# Patient Record
Sex: Male | Born: 1980 | Hispanic: Yes | Marital: Single | Smoking: Current some day smoker
Health system: Southern US, Community
[De-identification: ages and names within clinical notes are randomized; demographics above are authoritative.]

## PROBLEM LIST (undated history)

## (undated) DIAGNOSIS — Z789 Other specified health status: Secondary | ICD-10-CM

---

## 2003-08-13 HISTORY — PX: APPENDECTOMY: SHX54

## 2019-02-06 ENCOUNTER — Other Ambulatory Visit: Payer: Self-pay

## 2019-02-06 ENCOUNTER — Encounter (HOSPITAL_COMMUNITY): Payer: Self-pay | Admitting: *Deleted

## 2019-02-06 ENCOUNTER — Emergency Department (HOSPITAL_COMMUNITY): Payer: Self-pay

## 2019-02-06 ENCOUNTER — Emergency Department (HOSPITAL_COMMUNITY)
Admission: EM | Admit: 2019-02-06 | Discharge: 2019-02-06 | Disposition: A | Discharge status: Home / Self Care | Payer: Self-pay | Attending: Emergency Medicine | Admitting: Emergency Medicine

## 2019-02-06 DIAGNOSIS — Y9389 Activity, other specified: Secondary | ICD-10-CM | POA: Insufficient documentation

## 2019-02-06 DIAGNOSIS — Z23 Encounter for immunization: Secondary | ICD-10-CM | POA: Insufficient documentation

## 2019-02-06 DIAGNOSIS — Y999 Unspecified external cause status: Secondary | ICD-10-CM | POA: Insufficient documentation

## 2019-02-06 DIAGNOSIS — Y92511 Restaurant or cafe as the place of occurrence of the external cause: Secondary | ICD-10-CM | POA: Insufficient documentation

## 2019-02-06 DIAGNOSIS — S62617B Displaced fracture of proximal phalanx of left little finger, initial encounter for open fracture: Secondary | ICD-10-CM | POA: Insufficient documentation

## 2019-02-06 MED ORDER — LIDOCAINE HCL (PF) 1 % IJ SOLN
5.0000 mL | Freq: Once | INTRAMUSCULAR | Status: AC
  Administered 2019-02-06: 5 mL via INTRADERMAL
  Filled 2019-02-06: qty 5

## 2019-02-06 MED ORDER — CEPHALEXIN 500 MG PO CAPS: 500.0000 mg | ORAL_CAPSULE | Freq: Four times a day (QID) | ORAL | 0 refills | Status: DC

## 2019-02-06 MED ORDER — TETANUS-DIPHTH-ACELL PERTUSSIS 5-2.5-18.5 LF-MCG/0.5 IM SUSP
0.5000 mL | Freq: Once | INTRAMUSCULAR | Status: AC
  Administered 2019-02-06: 0.5 mL via INTRAMUSCULAR
  Filled 2019-02-06: qty 0.5

## 2019-02-06 MED ORDER — HYDROCODONE-ACETAMINOPHEN 5-325 MG PO TABS: 1.0000 | ORAL_TABLET | ORAL | 0 refills | Status: DC | PRN

## 2019-02-06 NOTE — Discharge Instructions (Signed)
Take the prescribed medication as directed.  Use caution when taking pain medication.  Make sure to keep sutures clean and dry.  They should be removed in 7-10 days. Follow-up with hand surgery-- call for appt.  Return to the ED for new or worsening symptoms.
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
## 2019-02-09 ENCOUNTER — Encounter (HOSPITAL_BASED_OUTPATIENT_CLINIC_OR_DEPARTMENT_OTHER): Payer: Self-pay | Admitting: *Deleted

## 2019-02-09 ENCOUNTER — Other Ambulatory Visit: Payer: Self-pay
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
## 2019-02-09 NOTE — Anesthesia Preprocedure Evaluation (Addendum)
Anesthesia Evaluation  Patient identified by MRN, date of birth, ID band Patient awake    Reviewed: Allergy & Precautions, NPO status , Patient's Chart, lab work & pertinent test results  Airway Mallampati: II  TM Distance: >3 FB Neck ROM: Full    Dental no notable dental hx. (+) Teeth Intact   Pulmonary neg pulmonary ROS,    Pulmonary exam normal breath sounds clear to auscultation       Cardiovascular Exercise Tolerance: Good negative cardio ROS Normal cardiovascular exam Rhythm:Regular Rate:Normal     Neuro/Psych negative neurological ROS  negative psych ROS   GI/Hepatic negative GI ROS, Neg liver ROS,   Endo/Other  negative endocrine ROS  Renal/GU negative Renal ROS     Musculoskeletal   Abdominal   Peds  Hematology negative hematology ROS (+)   Anesthesia Other Findings   Reproductive/Obstetrics                            Anesthesia Physical Anesthesia Plan  ASA: II  Anesthesia Plan: Regional   Post-op Pain Management:    Induction: Intravenous  PONV Risk Score and Plan: 2 and Treatment may vary due to age or medical condition, Ondansetron and Dexamethasone  Airway Management Planned: Nasal Cannula and Natural Airway  Additional Equipment:   Intra-op Plan:   Post-operative Plan:   Informed Consent: I have reviewed the patients History and Physical, chart, labs and discussed the procedure including the risks, benefits and alternatives for the proposed anesthesia with the patient or authorized representative who has indicated his/her understanding and acceptance.     Dental advisory given  Plan Discussed with: CRNA  Anesthesia Plan Comments: (L supraclavicular N. Block Spanish Speaking hx taken w help of translator)       Anesthesia Quick Evaluation

## 2019-02-10 ENCOUNTER — Other Ambulatory Visit (HOSPITAL_COMMUNITY)
Admission: RE | Admit: 2019-02-10 | Discharge: 2019-02-10 | Disposition: A | Discharge status: Home / Self Care | Payer: Self-pay | Source: Ambulatory Visit | Attending: Orthopedic Surgery | Admitting: Orthopedic Surgery

## 2019-02-10 ENCOUNTER — Ambulatory Visit (HOSPITAL_BASED_OUTPATIENT_CLINIC_OR_DEPARTMENT_OTHER): Payer: Self-pay | Admitting: Anesthesiology

## 2019-02-10 ENCOUNTER — Ambulatory Visit (HOSPITAL_BASED_OUTPATIENT_CLINIC_OR_DEPARTMENT_OTHER)
Admission: RE | Admit: 2019-02-10 | Discharge: 2019-02-10 | Disposition: A | Discharge status: Home / Self Care | Payer: Self-pay | Attending: Orthopedic Surgery | Admitting: Orthopedic Surgery

## 2019-02-10 ENCOUNTER — Encounter (HOSPITAL_BASED_OUTPATIENT_CLINIC_OR_DEPARTMENT_OTHER)
Admission: RE | Disposition: A | Discharge status: Home / Self Care | Payer: Self-pay | Source: Home / Self Care | Attending: Orthopedic Surgery

## 2019-02-10 ENCOUNTER — Encounter (HOSPITAL_BASED_OUTPATIENT_CLINIC_OR_DEPARTMENT_OTHER): Payer: Self-pay | Admitting: *Deleted

## 2019-02-10 DIAGNOSIS — Z1159 Encounter for screening for other viral diseases: Secondary | ICD-10-CM | POA: Insufficient documentation

## 2019-02-10 DIAGNOSIS — S62617B Displaced fracture of proximal phalanx of left little finger, initial encounter for open fracture: Secondary | ICD-10-CM | POA: Insufficient documentation

## 2019-02-10 DIAGNOSIS — S62617A Displaced fracture of proximal phalanx of left little finger, initial encounter for closed fracture: Secondary | ICD-10-CM

## 2019-02-10 HISTORY — DX: Other specified health status: Z78.9

## 2019-02-10 HISTORY — PX: OPEN REDUCTION INTERNAL FIXATION (ORIF) PROXIMAL PHALANX: SHX6235

## 2019-02-10 LAB — SARS CORONAVIRUS 2 BY RT PCR (HOSPITAL ORDER, PERFORMED IN ~~LOC~~ HOSPITAL LAB): SARS Coronavirus 2: NEGATIVE

## 2019-02-10 SURGERY — OPEN REDUCTION INTERNAL FIXATION (ORIF) PROXIMAL PHALANX
Anesthesia: Regional | Site: Hand | Laterality: Left

## 2019-02-10 MED ORDER — PROMETHAZINE HCL 25 MG/ML IJ SOLN: 6.2500 mg | INTRAMUSCULAR | Status: DC | PRN

## 2019-02-10 MED ORDER — CEFAZOLIN SODIUM-DEXTROSE 2-4 GM/100ML-% IV SOLN
2.0000 g | INTRAVENOUS | Status: AC
  Administered 2019-02-10: 2 g via INTRAVENOUS

## 2019-02-10 MED ORDER — ONDANSETRON HCL 4 MG/2ML IJ SOLN
INTRAMUSCULAR | Status: DC | PRN
  Administered 2019-02-10: 4 mg via INTRAVENOUS

## 2019-02-10 MED ORDER — FENTANYL CITRATE (PF) 100 MCG/2ML IJ SOLN
INTRAMUSCULAR | Status: AC
  Filled 2019-02-10: qty 2

## 2019-02-10 MED ORDER — CHLORHEXIDINE GLUCONATE 4 % EX LIQD: 60.0000 mL | Freq: Once | CUTANEOUS | Status: DC

## 2019-02-10 MED ORDER — HYDROMORPHONE HCL 1 MG/ML IJ SOLN: 0.2500 mg | INTRAMUSCULAR | Status: DC | PRN

## 2019-02-10 MED ORDER — MIDAZOLAM HCL 2 MG/2ML IJ SOLN
INTRAMUSCULAR | Status: AC
  Filled 2019-02-10: qty 2

## 2019-02-10 MED ORDER — LACTATED RINGERS IV SOLN: INTRAVENOUS | Status: DC

## 2019-02-10 MED ORDER — MIDAZOLAM HCL 2 MG/2ML IJ SOLN
1.0000 mg | INTRAMUSCULAR | Status: DC | PRN
  Administered 2019-02-10: 1 mg via INTRAVENOUS
  Administered 2019-02-10: 2 mg via INTRAVENOUS

## 2019-02-10 MED ORDER — ACETAMINOPHEN 10 MG/ML IV SOLN: 1000.0000 mg | Freq: Once | INTRAVENOUS | Status: DC | PRN

## 2019-02-10 MED ORDER — PROPOFOL 500 MG/50ML IV EMUL
INTRAVENOUS | Status: DC | PRN
  Administered 2019-02-10: 100 ug/kg/min via INTRAVENOUS

## 2019-02-10 MED ORDER — HYDROCODONE-ACETAMINOPHEN 7.5-325 MG PO TABS: 1.0000 | ORAL_TABLET | Freq: Once | ORAL | Status: DC | PRN

## 2019-02-10 MED ORDER — CEFAZOLIN SODIUM-DEXTROSE 2-4 GM/100ML-% IV SOLN
INTRAVENOUS | Status: AC
  Filled 2019-02-10: qty 100

## 2019-02-10 MED ORDER — FENTANYL CITRATE (PF) 100 MCG/2ML IJ SOLN
50.0000 ug | INTRAMUSCULAR | Status: DC | PRN
  Administered 2019-02-10: 100 ug via INTRAVENOUS
  Administered 2019-02-10: 25 ug via INTRAVENOUS

## 2019-02-10 MED ORDER — SCOPOLAMINE 1 MG/3DAYS TD PT72: 1.0000 | MEDICATED_PATCH | Freq: Once | TRANSDERMAL | Status: DC

## 2019-02-10 MED ORDER — LIDOCAINE HCL (CARDIAC) PF 100 MG/5ML IV SOSY
PREFILLED_SYRINGE | INTRAVENOUS | Status: DC | PRN
  Administered 2019-02-10: 30 mg via INTRAVENOUS

## 2019-02-10 MED ORDER — MEPERIDINE HCL 25 MG/ML IJ SOLN: 6.2500 mg | INTRAMUSCULAR | Status: DC | PRN

## 2019-02-10 SURGICAL SUPPLY — 71 items
BANDAGE ACE 3X5.8 VEL STRL LF (GAUZE/BANDAGES/DRESSINGS) IMPLANT
BENZOIN TINCTURE PRP APPL 2/3 (GAUZE/BANDAGES/DRESSINGS) IMPLANT
BIT DRILL 1.1 (BIT) ×1
BIT DRILL 1.5 MINI-QUICK CON (BIT) ×2 IMPLANT
BIT DRILL 60X20X1.1XQC TMX (BIT) ×1 IMPLANT
BIT DRL 60X20X1.1XQC TMX (BIT) ×1
BLADE SURG 15 STRL LF DISP TIS (BLADE) ×2 IMPLANT
BLADE SURG 15 STRL SS (BLADE) ×2
BNDG ELASTIC 2X5.8 VLCR STR LF (GAUZE/BANDAGES/DRESSINGS) IMPLANT
BNDG ESMARK 4X9 LF (GAUZE/BANDAGES/DRESSINGS) ×2 IMPLANT
CANISTER SUCT 1200ML W/VALVE (MISCELLANEOUS) IMPLANT
CORD BIPOLAR FORCEPS 12FT (ELECTRODE) ×2 IMPLANT
COVER BACK TABLE REUSABLE LG (DRAPES) ×2 IMPLANT
COVER WAND RF STERILE (DRAPES) IMPLANT
CUFF TOURN SGL QUICK 18X4 (TOURNIQUET CUFF) IMPLANT
DECANTER SPIKE VIAL GLASS SM (MISCELLANEOUS) IMPLANT
DRAPE EXTREMITY T 121X128X90 (DISPOSABLE) ×2 IMPLANT
DRAPE OEC MINIVIEW 54X84 (DRAPES) ×2 IMPLANT
DRAPE SURG 17X23 STRL (DRAPES) ×2 IMPLANT
DRSG EMULSION OIL 3X3 NADH (GAUZE/BANDAGES/DRESSINGS) ×2 IMPLANT
GAUZE 4X4 16PLY RFD (DISPOSABLE) IMPLANT
GLOVE BIO SURGEON STRL SZ 6.5 (GLOVE) ×2 IMPLANT
GLOVE BIO SURGEON STRL SZ7.5 (GLOVE) ×2 IMPLANT
GLOVE BIO SURGEON STRL SZ8 (GLOVE) ×2 IMPLANT
GLOVE BIOGEL PI IND STRL 7.0 (GLOVE) ×2 IMPLANT
GLOVE BIOGEL PI IND STRL 8 (GLOVE) ×1 IMPLANT
GLOVE BIOGEL PI IND STRL 8.5 (GLOVE) ×1 IMPLANT
GLOVE BIOGEL PI INDICATOR 7.0 (GLOVE) ×2
GLOVE BIOGEL PI INDICATOR 8 (GLOVE) ×1
GLOVE BIOGEL PI INDICATOR 8.5 (GLOVE) ×1
GOWN STRL REUS W/ TWL LRG LVL3 (GOWN DISPOSABLE) ×1 IMPLANT
GOWN STRL REUS W/TWL LRG LVL3 (GOWN DISPOSABLE) ×1
K-WIRE .045X4 (WIRE) ×2 IMPLANT
K-WIRE DBL TRONS .035X6 (WIRE) ×2
KWIRE DBL TRONS .035X6 (WIRE) ×1 IMPLANT
LOCK SCREW 1.5X9MM (Screw) ×2 IMPLANT
NEEDLE HYPO 25X1 1.5 SAFETY (NEEDLE) IMPLANT
NS IRRIG 1000ML POUR BTL (IV SOLUTION) ×2 IMPLANT
PACK BASIN DAY SURGERY FS (CUSTOM PROCEDURE TRAY) ×2 IMPLANT
PAD CAST 3X4 CTTN HI CHSV (CAST SUPPLIES) ×1 IMPLANT
PADDING CAST ABS 3INX4YD NS (CAST SUPPLIES) ×1
PADDING CAST ABS COTTON 3X4 (CAST SUPPLIES) ×1 IMPLANT
PADDING CAST COTTON 3X4 STRL (CAST SUPPLIES) ×1
PADDING UNDERCAST 2 STRL (CAST SUPPLIES)
PADDING UNDERCAST 2X4 STRL (CAST SUPPLIES) IMPLANT
PLATE BONE CONT 1.5MM LOCKING (Plate) ×2 IMPLANT
SCREW CORTICAL 1.5X9MM WRIST (Screw) ×4 IMPLANT
SCREW L 1.5X12 (Screw) ×6 IMPLANT
SCREW LOCK 1.5X9MM (Screw) ×1 IMPLANT
SCREW LOCKING 1.5X10 (Screw) ×6 IMPLANT
SCREW LOCKING 1.5X11MM (Screw) ×2 IMPLANT
SCREW NL 1.5X11 WRIST (Screw) ×4 IMPLANT
SCREW NL 1.5X12 (Screw) ×4 IMPLANT
SHEET MEDIUM DRAPE 40X70 STRL (DRAPES) ×2 IMPLANT
SLING ARM FOAM STRAP LRG (SOFTGOODS) ×2 IMPLANT
SPLINT FIBERGLASS 3X35 (CAST SUPPLIES) IMPLANT
STOCKINETTE 4X48 STRL (DRAPES) ×2 IMPLANT
STRIP CLOSURE SKIN 1/2X4 (GAUZE/BANDAGES/DRESSINGS) IMPLANT
SUCTION FRAZIER HANDLE 10FR (MISCELLANEOUS) ×1
SUCTION TUBE FRAZIER 10FR DISP (MISCELLANEOUS) ×1 IMPLANT
SUT MERSILENE 4 0 P 3 (SUTURE) ×2 IMPLANT
SUT MNCRL AB 3-0 PS2 18 (SUTURE) ×2 IMPLANT
SUT MON AB 4-0 PC3 18 (SUTURE) IMPLANT
SUT PROLENE 4 0 PS 2 18 (SUTURE) ×2 IMPLANT
SUT VIC AB 3-0 FS2 27 (SUTURE) ×2 IMPLANT
SYR BULB 3OZ (MISCELLANEOUS) ×2 IMPLANT
SYR CONTROL 10ML LL (SYRINGE) IMPLANT
TOWEL GREEN STERILE FF (TOWEL DISPOSABLE) ×4 IMPLANT
TRAY DSU PREP LF (CUSTOM PROCEDURE TRAY) ×2 IMPLANT
TUBE CONNECTING 20X1/4 (TUBING) ×2 IMPLANT
UNDERPAD 30X30 (UNDERPADS AND DIAPERS) ×2 IMPLANT

## 2019-02-10 NOTE — Anesthesia Procedure Notes (Signed)
Procedure Name: MAC Date/Time: 02/10/2019 3:38 PM Performed by: Signe Colt, CRNA Pre-anesthesia Checklist: Patient identified, Emergency Drugs available, Suction available, Patient being monitored and Timeout performed Patient Re-evaluated:Patient Re-evaluated prior to induction Oxygen Delivery Method: Simple face mask

## 2019-02-10 NOTE — Op Note (Signed)
PREOPERATIVE DIAGNOSIS: Left small finger open proximal phalanx fracture  POSTOPERATIVE DIAGNOSIS: Same  ATTENDING SURGEON: Dr. Bradly BienenstockFred Karlyn Glasco who scrubbed and present for the entire procedure  ASSISTANT SURGEON: Lambert ModySamantha Barton, PA-C was scrubbed and necessary for aid in reduction internal fixation closure and splinting in a timely fashion  ANESTHESIA: Regional with IV sedation  OPERATIVE PROCEDURE: #1: Open treatment of left small finger proximal phalanx fracture requiring internal fixation involve the articular surface of the interphalangeal joints #2: Open treatment of the small finger fracture with debridement and excision of skin subcutaneous tissue and bone #3: Radiographs 3 views left small finger  IMPLANTS: Biomet Webb plate 1.5 millimeter screws  RADIOGRAPHIC INTERPRETATION: AP lateral oblique views of the finger do show the dorsal plate fixation place with a comminuted proximal phalanx fracture with good alignment in both planes  SURGICAL INDICATIONS: Patient is a right-hand-dominant gentleman is referred from the ER who sustained the open small finger proximal phalanx fracture.  Patient was seen evaluate the office and recommended undergo the above procedure.  The risks of surgery include but not limited to bleeding infection damage nearby nerves arteries or tendons loss of motion of the wrist and digits incomplete relief of symptoms and need for further surgical intervention.  SURGICAL TECHNIQUE: Patient palpated via the preoperative holding area marked for marker made on left small finger to take the correct operative site.  Patient brought back to operating placed supine on the anesthesia table where the regional anesthetic had been administered.  Patient tolerates well.  Well-padded tourniquet placed on the left brachium stay with appropriate drape.  Left upper extremities then prepped and draped normal sterile fashion.  A timeout was called the correct site was identified  procedure then begun.  Attention then turned the small finger.  The open fracture was then opened up the wound was then extended proximally and distally.  The wound was thoroughly irrigated.  The extensor mechanism incised longitudinally expose the open fracture site.  Debridement of the skin subcutaneous tissue and bone associated with the open fracture was then carried out with small knife curettes and rongeurs.  The devitalized tissue as well as devitalized bone was then removed.  There is a highly comminuted fracture extending well into both articular surfaces both of the MP and interphalangeal joint.  The wound was thoroughly irrigated.  Following this the webb plate was then applied given the highly comminution allowing the screws to be placed in different planes.  K wire fixation was then proximally distally to hold the bone in place.  The bone was brought out to length.  The concept of internal/external fixator was then used spanning the the comminuted defect with the locking screws and nonlocking screws proximally and distally.  The wound was thoroughly irrigated.  Final radiographs were then obtained.  There showed good alignment in both planes with a dorsal plate in place.  The extensor mechanism was was then closed with Mersilene suture 4 oh.  The skin was then closed with simple 4-0 Prolene.  Adaptic dressing a sterile compressive bandage then applied.  The patient is then placed in a well-padded ulnar gutter splint taken recovery in good condition.  POSTOPERATIVE PLAN: Patient be discharged to home.  See him back in the office in approximately 8 days for wound check x-rays back in the ulnar gutter splint will keep the splint we will write [facility]based ulnar gutter splint begin some gentle active range of motion.  Radiographs at  each visit.  Sutures out at the 2-week mark.

## 2019-02-10 NOTE — Anesthesia Postprocedure Evaluation (Signed)
Anesthesia Post Note  Patient: Angel Barajas  Procedure(s) Performed: Left small finger fracture open reduction and surgical fixation, surgery as indicated (Left Hand)     Patient location during evaluation: PACU Anesthesia Type: Regional Level of consciousness: awake and alert Pain management: pain level controlled Vital Signs Assessment: post-procedure vital signs reviewed and stable Respiratory status: spontaneous breathing, nonlabored ventilation, respiratory function stable and patient connected to nasal cannula oxygen Cardiovascular status: stable and blood pressure returned to baseline Postop Assessment: no apparent nausea or vomiting Anesthetic complications: no    Last Vitals:  Vitals:   02/10/19 1746 02/10/19 1748  BP: (!) 156/105 128/89  Pulse: 69 77  Resp: 19 18  Temp:    SpO2: 99% 97%    Last Pain:  Vitals:   02/10/19 1748  TempSrc:   PainSc: 0-No pain                 Barnet Glasgow

## 2019-02-10 NOTE — Transfer of Care (Signed)
Immediate Anesthesia Transfer of Care Note  Patient: Angel Barajas  Procedure(s) Performed: Left small finger fracture open reduction and surgical fixation, surgery as indicated (Left Hand)  Patient Location: PACU  Anesthesia Type:MAC combined with regional for post-op pain  Level of Consciousness: awake, alert , oriented and patient cooperative  Airway & Oxygen Therapy: Patient Spontanous Breathing and Patient connected to nasal cannula oxygen  Post-op Assessment: Report given to RN and Post -op Vital signs reviewed and stable  Post vital signs: Reviewed and stable  Last Vitals:  Vitals Value Taken Time  BP    Temp    Pulse 73 02/10/19 1735  Resp    SpO2 99 % 02/10/19 1735  Vitals shown include unvalidated device data.  Last Pain:  Vitals:   02/10/19 1352  TempSrc: Oral  PainSc: 0-No pain         Complications: No apparent anesthesia complications

## 2019-02-10 NOTE — Anesthesia Procedure Notes (Signed)
Anesthesia Regional Block: Supraclavicular block   Pre-Anesthetic Checklist: ,, timeout performed, Correct Patient, Correct Site, Correct Laterality, Correct Procedure, Correct Position, site marked, Risks and benefits discussed,  Surgical consent,  Pre-op evaluation,  At surgeon's request and post-op pain management  Laterality: Left  Prep: chloraprep       Needles:  Injection technique: Single-shot  Needle Type: Echogenic Needle     Needle Length: 5cm  Needle Gauge: 21     Additional Needles:   Procedures:,,,, ultrasound used (permanent image in chart),,,,  Narrative:  Start time: 02/10/2019 2:15 PM End time: 02/10/2019 2:22 PM Injection made incrementally with aspirations every 5 mL.  Performed by: Personally  Anesthesiologist: Barnet Glasgow, MD

## 2019-02-10 NOTE — Progress Notes (Signed)
Assisted Dr. Houser with left, ultrasound guided, supraclavicular block. Side rails up, monitors on throughout procedure. See vital signs in flow sheet. Tolerated Procedure well. °

## 2019-02-10 NOTE — Discharge Instructions (Signed)
KEEP BANDAGE CLEAN AND DRY °CALL OFFICE FOR F/U APPT [phone] in 8 days °KEEP HAND ELEVATED ABOVE HEART °OK TO APPLY ICE TO OPERATIVE AREA °CONTACT OFFICE IF ANY WORSENING PAIN OR CONCERNS. ° ° °Post Anesthesia Home Care Instructions ° °Activity: °Get plenty of rest for the remainder of the day. A responsible individual must stay with you for 24 hours following the procedure.  °For the next 24 hours, DO NOT: °-Drive a car °-Operate machinery °-Drink alcoholic beverages °-Take any medication unless instructed by your physician °-Make any legal decisions or sign important papers. ° °Meals: °Start with liquid foods such as gelatin or soup. Progress to regular foods as tolerated. Avoid greasy, spicy, heavy foods. If nausea and/or vomiting occur, drink only clear liquids until the nausea and/or vomiting subsides. Call your physician if vomiting continues. ° °Special Instructions/Symptoms: °Your throat may feel dry or sore from the anesthesia or the breathing tube placed in your throat during surgery. If this causes discomfort, gargle with warm salt water. The discomfort should disappear within 24 hours. ° °If you had a scopolamine patch placed behind your ear for the management of post- operative nausea and/or vomiting: ° °1. The medication in the patch is effective for 72 hours, after which it should be removed.  Wrap patch in a tissue and discard in the trash. Wash hands thoroughly with soap and water. °2. You may remove the patch earlier than 72 hours if you experience unpleasant side effects which may include dry mouth, dizziness or visual disturbances. °3. Avoid touching the patch. Wash your hands with soap and water after contact with the patch. °   ° °

## 2019-02-15 ENCOUNTER — Encounter (HOSPITAL_BASED_OUTPATIENT_CLINIC_OR_DEPARTMENT_OTHER): Payer: Self-pay | Admitting: Orthopedic Surgery

## 2019-03-05 ENCOUNTER — Other Ambulatory Visit: Payer: Self-pay

## 2019-03-05 ENCOUNTER — Ambulatory Visit: Payer: Self-pay | Attending: Orthopedic Surgery | Admitting: Occupational Therapy

## 2019-03-05 DIAGNOSIS — M6281 Muscle weakness (generalized): Secondary | ICD-10-CM

## 2019-03-05 DIAGNOSIS — M25642 Stiffness of left hand, not elsewhere classified: Secondary | ICD-10-CM

## 2019-03-05 DIAGNOSIS — R278 Other lack of coordination: Secondary | ICD-10-CM

## 2019-03-05 DIAGNOSIS — M79642 Pain in left hand: Secondary | ICD-10-CM
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
## 2019-03-05 NOTE — Patient Instructions (Signed)
Your Splint This splint should initially be fitted by a healthcare practitioner.  The healthcare practitioner is responsible for providing wearing instructions and precautions to the patient, other healthcare practitioners and care provider involved in the patient's care.  This splint was custom made for you. Please read the following instructions to learn about wearing and caring for your splint.  Precautions Should your splint cause any of the following problems, remove the splint immediately and contact your therapist/physician.  Swelling  Severe Pain  Pressure Areas or redness  Stiffness  Numbness  Do not wear your splint while operating machinery unless it has been fabricated for that purpose.  When To Wear Your Splint Where your splint according to your therapist/physician instructions. All the time except for bathing and exercising, do not submerge your hand, just bathe in the shower. Remove your splint to exercise every 2 hours, keep your hand clean and dry No heavy lifting  Care and Cleaning of Your Splint 1. Keep your splint away from open flames. 2. Your splint will lose its shape in temperatures over 135 degrees Farenheit, ( in car windows, near radiators, ovens or in hot water).  Never make any adjustments to your splint, if the splint needs adjusting remove it and make an appointment to see your therapist. 3. Your splint, including the cushion liner may be cleaned with soap and lukewarm water.  Do not immerse in hot water over 135 degrees Farenheit. 4. Straps may be washed with soap and water, but do not moisten the self-adhesive portion.       MP Flexion (Active Isolated)   Bend ___small___ finger at large knuckle, keeping other fingers straight. Do not bend tips. Repeat _10-15___ times. Do __4-6__ sessions per day.  AROM: PIP Flexion / Extension   Pinch bottom knuckle of __small______ finger of hand to prevent bending. Actively bend middle knuckle until  stretch is felt. Hold __5__ seconds. Relax. Straighten finger as far as possible. Repeat __10-15__ times per set. Do _4-6___ sessions per day.   AROM: DIP Flexion / Extension   Pinch middle knuckle of ___small _____ finger of  hand to prevent bending. Bend end knuckle until stretch is felt. Hold _5___ seconds. Relax. Straighten finger as far as possible. Repeat _10-15___ times per set.  Do _4-6___ sessions per day.  AROM: Finger Flexion / Extension   Actively bend fingers of  hand. Start with knuckles furthest from palm, and slowly make a fist. Hold __5__ seconds. Relax. Then straighten fingers as far as possible. Repeat _10-15___ times per set.  Do _4-6___ sessions per day.  Copyright  VHI. All rights reserved.

## 2019-03-10 ENCOUNTER — Other Ambulatory Visit: Payer: Self-pay

## 2019-03-10 ENCOUNTER — Ambulatory Visit: Payer: Self-pay | Admitting: Occupational Therapy

## 2019-03-10 DIAGNOSIS — M25642 Stiffness of left hand, not elsewhere classified: Secondary | ICD-10-CM

## 2019-03-10 DIAGNOSIS — M79642 Pain in left hand: Secondary | ICD-10-CM

## 2019-03-10 NOTE — Patient Instructions (Signed)
PROM: Finger MP Joints   Passively bend ___small_____ finger of hand at big knuckle until stretch is felt. Hold _10___ seconds. Relax. Straighten finger as far as possible. Repeat __5__ times per set.  Do __6__ sessions per day.   PIP Flexion (Passive)   Use other hand to bend the middle joint of ___small___ finger down as far as possible, while keeping big knuckle straight. Hold _10___ seconds. Repeat __5__ times. Do _6___ sessions per day.    PROM: Finger DIP Joints   Passively bend ___small_____ finger(s) of  hand at tip joint until stretch is felt, keeping middle joint straight. Hold __10__ seconds. Relax. Straighten finger as far as possible. Repeat _5___ times per set.  Do __6__ sessions per day.  MP / PIP / DIP Composite Flexion (Passive Stretch)    Use su otra mano para doblar las tres articulaciones del dedo _pinky_____. Mantenga _10___ segundos. Repita __5__ veces. Haga __6__ sesiones por da.  PIP Extension (Passive    Coloque el pulgar de su otra mano por encima de la articulacin y dos dedos por debajo a cada lado para enderezar la articulacin media del dedo _pinky_____. Mantenga _10___ segundos. Repita __5__ veces. Haga _6___ sesiones por da.  Copyright  VHI. All rights reserved.

## 2019-03-10 NOTE — Therapy (Signed)
[facility] [address] [place], [place], [address] Phone: [phone]   Fax:  [phone]  Occupational Therapy Treatment  Patient Details  Name: Angel BrownsMario Barajas MRN: [id number] Date of Birth: Aug 05, 1981 Referring Provider (OT): Dr. Melvyn Novasrtmann   Encounter Date: 03/10/2019  OT End of Session - 03/10/19 0851    Visit Number  2    Number of Visits  12    Date for OT Re-Evaluation  06/03/19    Authorization Type  self pay    OT Start Time  0800    OT Stop Time  0850    OT Time Calculation (min)  50 min    Activity Tolerance  Patient tolerated treatment well    Behavior During Therapy  [facility] for tasks assessed/performed       Past Medical History:  Diagnosis Date  . Medical history non-contributory     Past Surgical History:  Procedure Laterality Date  . APPENDECTOMY  2005  . OPEN REDUCTION INTERNAL FIXATION (ORIF) PROXIMAL PHALANX Left 02/10/2019   Procedure: Left small finger fracture open reduction and surgical fixation, surgery as indicated;  Surgeon: Bradly Bienenstockrtmann, Fred, MD;  Location: [facility];  Service: Orthopedics;  Laterality: Left;  45min    There were no vitals filed for this visit.  Subjective Assessment - 03/10/19 0806    Subjective   I fogot my splint (per girlfriend)    Patient is accompanied by:  Family member   girlfriend   Currently in Pain?  Yes    Pain Score  3    UP TO 10/10 w/ passive PIP flexion   Pain Location  Finger (Comment which one)   small finger   Pain Orientation  Left    Pain Descriptors / Indicators  Aching    Pain Type  Acute pain    Pain Onset  1 to 4 weeks ago    Pain Frequency  Constant    Aggravating Factors   exercise, P/ROM    Pain Relieving Factors  rest, medicine       CLINIC OPERATION CHANGES: Outpatient Neuro Rehab is open at lower capacity following universal masking, social distancing, and patient screening.  The patient's COVID risk of complications  score is 2.   Pt forgot to don splint after showering this morning, and therefore arrived w/o splint. Emphasized that he still needs to wear between ex's and at night and to only remove for ex's and showering.   Reviewed A/ROM HEP. Added P/ROM HEP for isolated MP, PIP, and DIP flexion, and composite passive flexion and extension. Also instructed in place and hold ex's for PIP and DIP flexion as able. Pt unable to maintain flexion in either joint after placement.   Pt/girlfriend instructed in scar massage to perform dorsally along incision as well as volarly (suspect scar tissue adhering to tendons)  Girlfriend present and interpreted all instruction                  OT Education - 03/10/19 0831    Education Details  P/ROM HEP    Person(s) Educated  Patient   GIRLFRIEND   Methods  Explanation;Demonstration;Handout    Comprehension  Verbalized understanding;Returned demonstration       OT Short Term Goals - 03/10/19 0851      OT SHORT TERM GOAL #1   Title  I wiuth initial HEP.    Time  6    Period  Weeks    Status  On-going  Target Date  04/19/19      OT SHORT TERM GOAL #2   Title  I with splint wear care and precautions.    Time  6    Period  Weeks    Status  On-going      OT SHORT TERM GOAL #3   Title  Pt will increase A/ROM MP flexion to 75* for increased LUE functional use.    Time  6    Period  Weeks    Status  On-going      OT SHORT TERM GOAL #4   Title  Pt will demonstrate at least 60* PIP flexion in prep for increased functional use.    Time  6    Period  Weeks    Status  On-going        OT Long Term Goals - 03/05/19 1343      OT LONG TERM GOAL #1   Title  I with updated HEP.    Time  12    Period  Weeks    Status  New    Target Date  06/03/19      OT LONG TERM GOAL #2   Title  Pt will demonstrate grip strength of at least 30 lbs for increased LUE functional use.    Time  12    Period  Weeks    Status  New      OT LONG TERM GOAL  #3   Title  Pt will demonstrate at least 90% composite flexion for LUE including 5th digit.    Baseline  inially only grossly 50% composite finger flexion,    Time  12    Period  Weeks    Status  New      OT LONG TERM GOAL #4   Title  Pt will resume use of LUE as a non dominant assist 90% of the time for ADLs/ IADLs with pain less than or equal to 3/10    Time  12    Period  Weeks    Status  New            Plan - 03/10/19 [id number]    Clinical Impression Statement  Pt returns today after initial eval and splinting to progress HEP to include P/ROM. Pt is now 4 weeks post-op and very stiff w/ very limited A/ROM and P/ROM at PIP joint and A/ROM at DIP joint    Occupational performance deficits (Please refer to evaluation for details):  ADL's;IADL's;Work;Leisure;Social Participation    Body Structure / Function / Physical Skills  ADL;Edema;Balance;Endurance;Mobility;Strength;Flexibility;UE functional use;FMC;Pain;Coordination;Decreased knowledge of precautions;GMC;ROM;Decreased knowledge of use of DME;Dexterity;IADL    Rehab Potential  Good    OT Frequency  1x / week    OT Duration  12 weeks    OT Treatment/Interventions  Self-care/ADL training;Therapeutic exercise;Ultrasound;Neuromuscular education;Manual Therapy;Splinting;Therapeutic activities;Cryotherapy;Paraffin;DME and/or AE instruction;Fluidtherapy;Electrical Stimulation;Moist Heat;Contrast Bath;Passive range of motion;Patient/family education    Plan  try ultrasound both dorsally and volarly at small finger proximal phalanx, splint check/adjustment prn as pt did not bring today, continue A/ROM, P/ROM, and place and hold as able    Consulted and Agree with Plan of Care  Patient;Family member/caregiver    Family Member Consulted  girlfriend       Patient will benefit from skilled therapeutic intervention in order to improve the following deficits and impairments:   Body Structure / Function / Physical Skills: ADL, Edema, Balance,  Endurance, Mobility, Strength, Flexibility, UE functional use, FMC, Pain, Coordination, Decreased knowledge of precautions,  GMC, ROM, Decreased knowledge of use of DME, Dexterity, IADL       Visit Diagnosis: 1. Stiffness of left hand, not elsewhere classified   2. Pain in left hand       Problem List There are no active problems to display for this patient.   Carey Bullocks, OTR/L 03/10/2019, 8:55 AM  [facility] [address] [place], [place], [address] Phone: [phone]   Fax:  [phone]  Name: Ra Pfiester MRN: [id number] Date of Birth: Jun 26, 1981

## 2019-03-26 ENCOUNTER — Ambulatory Visit: Payer: Self-pay | Admitting: Occupational Therapy

## 2019-04-02 ENCOUNTER — Ambulatory Visit: Payer: Self-pay | Attending: Orthopedic Surgery | Admitting: Occupational Therapy

## 2019-04-02 ENCOUNTER — Other Ambulatory Visit: Payer: Self-pay

## 2019-04-02 DIAGNOSIS — R278 Other lack of coordination: Secondary | ICD-10-CM | POA: Insufficient documentation

## 2019-04-02 DIAGNOSIS — M6281 Muscle weakness (generalized): Secondary | ICD-10-CM | POA: Insufficient documentation

## 2019-04-02 DIAGNOSIS — M79642 Pain in left hand: Secondary | ICD-10-CM | POA: Insufficient documentation

## 2019-04-02 DIAGNOSIS — M25642 Stiffness of left hand, not elsewhere classified: Secondary | ICD-10-CM | POA: Insufficient documentation

## 2019-04-02 NOTE — Therapy (Signed)
[facility] [address] [place], [place], [address] Phone: [phone]   Fax:  [phone]  Occupational Therapy Treatment  Patient Details  Name: Angel Barajas MRN: [id number] Date of Birth: Feb 22, 1981 Referring Provider (OT): Dr. Caralyn Guile   Encounter Date: 04/02/2019  OT End of Session - 04/02/19 1646    Visit Number  3    Number of Visits  12    Date for OT Re-Evaluation  06/03/19    Authorization Type  self pay    OT Start Time  0810    OT Stop Time  0845    OT Time Calculation (min)  35 min    Activity Tolerance  Patient tolerated treatment well    Behavior During Therapy  [facility] for tasks assessed/performed       Past Medical History:  Diagnosis Date  . Medical history non-contributory     Past Surgical History:  Procedure Laterality Date  . APPENDECTOMY  2005  . OPEN REDUCTION INTERNAL FIXATION (ORIF) PROXIMAL PHALANX Left 02/10/2019   Procedure: Left small finger fracture open reduction and surgical fixation, surgery as indicated;  Surgeon: Iran Planas, MD;  Location: [facility];  Service: Orthopedics;  Laterality: Left;  85min    There were no vitals filed for this visit.           Treatment: Fluidotherapy x 10 mins to RUE for stiffness, no adverse reactions. Splint modifications for improved fit ar webspace and thumb. Pt reports increased comfort following modifications. Reveiwed composite A/ROM, PROM exercises as well as blocking PIP and DIP joints followed by  place and holds for composite flexion , pt demonstrates significantly impaired ROM.               OT Short Term Goals - 03/10/19 1025      OT SHORT TERM GOAL #1   Title  I wiuth initial HEP.    Time  6    Period  Weeks    Status  On-going    Target Date  04/19/19      OT SHORT TERM GOAL #2   Title  I with splint wear care and precautions.    Time  6    Period  Weeks    Status  On-going      OT  SHORT TERM GOAL #3   Title  Pt will increase A/ROM MP flexion to 75* for increased LUE functional use.    Time  6    Period  Weeks    Status  On-going      OT SHORT TERM GOAL #4   Title  Pt will demonstrate at least 60* PIP flexion in prep for increased functional use.    Time  6    Period  Weeks    Status  On-going        OT Long Term Goals - 03/05/19 1343      OT LONG TERM GOAL #1   Title  I with updated HEP.    Time  12    Period  Weeks    Status  New    Target Date  06/03/19      OT LONG TERM GOAL #2   Title  Pt will demonstrate grip strength of at least 30 lbs for increased LUE functional use.    Time  12    Period  Weeks    Status  New      OT LONG TERM GOAL #3  Title  Pt will demonstrate at least 90% composite flexion for LUE including 5th digit.    Baseline  inially only grossly 50% composite finger flexion,    Time  12    Period  Weeks    Status  New      OT LONG TERM GOAL #4   Title  Pt will resume use of LUE as a non dominant assist 90% of the time for ADLs/ IADLs with pain less than or equal to 3/10    Time  12    Period  Weeks    Status  New            Plan - 04/02/19 1647    Clinical Impression Statement  Pt is now 7 weeks post-op and very stiff w/ very limited A/ROM and P/ROM at PIP joint and A/ROM at DIP joint. Pt was encouraged to exercise more frequently.    Occupational performance deficits (Please refer to evaluation for details):  ADL's;IADL's;Work;Leisure;Social Participation    Body Structure / Function / Physical Skills  ADL;Edema;Balance;Endurance;Mobility;Strength;Flexibility;UE functional use;FMC;Pain;Coordination;Decreased knowledge of precautions;GMC;ROM;Decreased knowledge of use of DME;Dexterity;IADL    Rehab Potential  Good    OT Frequency  1x / week    OT Duration  12 weeks    OT Treatment/Interventions  Self-care/ADL training;Therapeutic exercise;Ultrasound;Neuromuscular education;Manual Therapy;Splinting;Therapeutic  activities;Cryotherapy;Paraffin;DME and/or AE instruction;Fluidtherapy;Electrical Stimulation;Moist Heat;Contrast Bath;Passive range of motion;Patient/family education    Plan  try ultrasound both dorsally and volarly at small finger proximal phalanx,  continue A/ROM, P/ROM, and place and hold as able    Consulted and Agree with Plan of Care  Patient;Family member/caregiver    Family Member Consulted  girlfriend       Patient will benefit from skilled therapeutic intervention in order to improve the following deficits and impairments:   Body Structure / Function / Physical Skills: ADL, Edema, Balance, Endurance, Mobility, Strength, Flexibility, UE functional use, FMC, Pain, Coordination, Decreased knowledge of precautions, GMC, ROM, Decreased knowledge of use of DME, Dexterity, IADL       Visit Diagnosis: No diagnosis found.    Problem List There are no active problems to display for this patient.   Angel Barajas 04/02/2019, 4:49 PM   [facility] [address] [place], [place], [address] Phone: [phone]   Fax:  [phone]  Name: Angel Barajas MRN: [id number] Date of Birth: 1980/11/01

## 2019-04-07 ENCOUNTER — Other Ambulatory Visit: Payer: Self-pay

## 2019-04-07 ENCOUNTER — Ambulatory Visit: Payer: Self-pay | Admitting: Occupational Therapy

## 2019-04-07 DIAGNOSIS — M79642 Pain in left hand: Secondary | ICD-10-CM

## 2019-04-07 DIAGNOSIS — M6281 Muscle weakness (generalized): Secondary | ICD-10-CM

## 2019-04-07 DIAGNOSIS — M25642 Stiffness of left hand, not elsewhere classified: Secondary | ICD-10-CM

## 2019-04-07 DIAGNOSIS — R278 Other lack of coordination: Secondary | ICD-10-CM

## 2019-04-07 NOTE — Therapy (Signed)
[facility] [address] [place], [place], [address] Phone: [phone]   Fax:  [phone]  Occupational Therapy Treatment  Patient Details  Name: Angel BrownsMario Barajas MRN: [id number] Date of Birth: [birth year] Referring Provider (OT): Dr. Melvyn Novasrtmann   Encounter Date: 04/07/2019  OT End of Session - 04/07/19 1050    Visit Number  4    Number of Visits  12    Date for OT Re-Evaluation  06/03/19    Authorization Type  self pay    OT Start Time  1021    OT Stop Time  1058    OT Time Calculation (min)  37 min    Activity Tolerance  Patient tolerated treatment well    Behavior During Therapy  [facility] for tasks assessed/performed       Past Medical History:  Diagnosis Date  . Medical history non-contributory     Past Surgical History:  Procedure Laterality Date  . APPENDECTOMY  2005  . OPEN REDUCTION INTERNAL FIXATION (ORIF) PROXIMAL PHALANX Left 02/10/2019   Procedure: Left small finger fracture open reduction and surgical fixation, surgery as indicated;  Surgeon: Bradly Bienenstockrtmann, Fred, MD;  Location: [facility];  Service: Orthopedics;  Laterality: Left;  45min    There were no vitals filed for this visit.  Subjective Assessment - 04/07/19 1441    Patient is accompanied by:  Family member   girlfriend   Currently in Pain?  Yes    Pain Score  3     Pain Location  Finger (Comment which one)    Pain Orientation  Left    Pain Descriptors / Indicators  Aching    Pain Type  Acute pain    Pain Onset  More than a month ago    Pain Frequency  Intermittent    Aggravating Factors   exercise    Pain Relieving Factors  rest              Treatment: Ultrasound 3mhz, 0.8 w/cm 2, 20%x8 mins  to volar left 5th digit, no adverse reactions. PROM composite finger flexion while estim applied to finger flexors 50 pps, 250 pw, 10 secs cycle intenity 22, x 8  mins no adverse reactions. P/RPM composite finger flexion, place and  holds, PIP and DIP flexion, attempted A/ROM DIP and PIP flexion however pt demonstrates minimal movement. Pt arrived wearing splint today. He reports that if feels better following adjustments. Pt was instructed to continue wearing splint at work however while he is at home he should remove splint and exercise his hand more often.               OT Short Term Goals - 03/10/19 [id number]      OT SHORT TERM GOAL #1   Title  I wiuth initial HEP.    Time  6    Period  Weeks    Status  On-going    Target Date  04/19/19      OT SHORT TERM GOAL #2   Title  I with splint wear care and precautions.    Time  6    Period  Weeks    Status  On-going      OT SHORT TERM GOAL #3   Title  Pt will increase A/ROM MP flexion to 75* for increased LUE functional use.    Time  6    Period  Weeks    Status  On-going      OT SHORT TERM GOAL #  4   Title  Pt will demonstrate at least 60* PIP flexion in prep for increased functional use.    Time  6    Period  Weeks    Status  On-going        OT Long Term Goals - 03/05/19 1343      OT LONG TERM GOAL #1   Title  I with updated HEP.    Time  12    Period  Weeks    Status  New    Target Date  06/03/19      OT LONG TERM GOAL #2   Title  Pt will demonstrate grip strength of at least 30 lbs for increased LUE functional use.    Time  12    Period  Weeks    Status  New      OT LONG TERM GOAL #3   Title  Pt will demonstrate at least 90% composite flexion for LUE including 5th digit.    Baseline  inially only grossly 50% composite finger flexion,    Time  12    Period  Weeks    Status  New      OT LONG TERM GOAL #4   Title  Pt will resume use of LUE as a non dominant assist 90% of the time for ADLs/ IADLs with pain less than or equal to 3/10    Time  12    Period  Weeks    Status  New            Plan - 04/07/19 1223    Clinical Impression Statement  Pt demonstrates limited progress towards goals. Pt has significant stiffness in 5th  digit and minimal active movement at DIP, PIP.    Occupational performance deficits (Please refer to evaluation for details):  ADL's;IADL's;Work;Leisure;Social Participation    Body Structure / Function / Physical Skills  ADL;Edema;Balance;Endurance;Mobility;Strength;Flexibility;UE functional use;FMC;Pain;Coordination;Decreased knowledge of precautions;GMC;ROM;Decreased knowledge of use of DME;Dexterity;IADL    Rehab Potential  Good    OT Frequency  1x / week    OT Duration  12 weeks    OT Treatment/Interventions  Self-care/ADL training;Therapeutic exercise;Ultrasound;Neuromuscular education;Manual Therapy;Splinting;Therapeutic activities;Cryotherapy;Paraffin;DME and/or AE instruction;Fluidtherapy;Electrical Stimulation;Moist Heat;Contrast Bath;Passive range of motion;Patient/family education    Plan  paraffin with hand wrapped in flexion ultrasound  at small finger proximal phalanx,  continue A/ROM, P/ROM, and place and hold as able    Consulted and Agree with Plan of Care  Patient;Family member/caregiver    Family Member Consulted  girlfriend       Patient will benefit from skilled therapeutic intervention in order to improve the following deficits and impairments:   Body Structure / Function / Physical Skills: ADL, Edema, Balance, Endurance, Mobility, Strength, Flexibility, UE functional use, FMC, Pain, Coordination, Decreased knowledge of precautions, GMC, ROM, Decreased knowledge of use of DME, Dexterity, IADL       Visit Diagnosis: Stiffness of left hand, not elsewhere classified  Pain in left hand  Muscle weakness (generalized)  Other lack of coordination    Problem List There are no active problems to display for this patient.   Mehar Sagen 04/07/2019, 2:42 PM  [facility] [address] [place], [place], [address] Phone: [phone]   Fax:  [phone]  Name: Angel Barajas MRN: [id number] Date of Birth:  Dec 27, 1980

## 2019-04-12 ENCOUNTER — Ambulatory Visit: Payer: Self-pay | Admitting: Occupational Therapy

## 2019-04-21 ENCOUNTER — Ambulatory Visit: Payer: Self-pay | Attending: Orthopedic Surgery | Admitting: Occupational Therapy

## 2019-04-21 ENCOUNTER — Encounter: Payer: Self-pay | Admitting: Occupational Therapy

## 2019-04-21 ENCOUNTER — Other Ambulatory Visit: Payer: Self-pay

## 2019-04-21 DIAGNOSIS — M79642 Pain in left hand: Secondary | ICD-10-CM | POA: Insufficient documentation

## 2019-04-21 DIAGNOSIS — M6281 Muscle weakness (generalized): Secondary | ICD-10-CM | POA: Insufficient documentation

## 2019-04-21 DIAGNOSIS — R278 Other lack of coordination: Secondary | ICD-10-CM | POA: Insufficient documentation

## 2019-04-21 DIAGNOSIS — M25642 Stiffness of left hand, not elsewhere classified: Secondary | ICD-10-CM | POA: Insufficient documentation

## 2019-04-21 NOTE — Patient Instructions (Addendum)
   1. Grip Strengthening (Resistive Putty)   Squeeze putty using thumb and all fingers. Repeat _20___ times. Do __2__ sessions per day.      Copyright  VHI. All rights reserved.            Dr. Caralyn Guile, Angel Barajas has been receiving occupational therapy. Angel Barajas progress has been limited. He is unable to actively flex at PIP and DIP joints of 5th digit left hand. He demonstrates 90 MP flexion at 5th digit. We have used fluidotherapy, massage, ultrasound, estim and exercise to attempt to increase Angel Barajas movement. Please advise as to whether you think continued therapy will be benefical or if he has reached maximum benefit.  Thanks,  [employer], OTR/L

## 2019-04-21 NOTE — Therapy (Signed)
[facility] [address] [place], [place], [address] Phone: [phone]   Fax:  [phone]  Occupational Therapy Treatment  Patient Details  Name: Angel Barajas MRN: [id number] Date of Birth: 1981/04/24 Referring Provider (OT): Dr. Caralyn Guile   Encounter Date: 04/21/2019  OT End of Session - 04/21/19 1031    Visit Number  5    Number of Visits  12    Date for OT Re-Evaluation  06/03/19    Authorization Type  self pay    OT Start Time  1018    OT Stop Time  1100    OT Time Calculation (min)  42 min    Activity Tolerance  Patient tolerated treatment well    Behavior During Therapy  [facility] for tasks assessed/performed       Past Medical History:  Diagnosis Date  . Medical history non-contributory     Past Surgical History:  Procedure Laterality Date  . APPENDECTOMY  2005  . OPEN REDUCTION INTERNAL FIXATION (ORIF) PROXIMAL PHALANX Left 02/10/2019   Procedure: Left small finger fracture open reduction and surgical fixation, surgery as indicated;  Surgeon: Iran Planas, MD;  Location: [facility];  Service: Orthopedics;  Laterality: Left;  84mn    There were no vitals filed for this visit.  Subjective Assessment - 04/21/19 1031    Patient is accompanied by:  --   girlfriend   Currently in Pain?  Yes    Pain Score  3     Pain Location  Finger (Comment which one)    Pain Orientation  Left    Pain Descriptors / Indicators  Aching    Pain Onset  More than a month ago    Pain Frequency  Intermittent    Aggravating Factors   exercise    Pain Relieving Factors  heat              Treatment: Fluidotherapy to left hand x 10 mins with fingers wrapped in coban in flexion. No adverse reactions. Retrograde massage to 5th digit followed by passive composite finger flexion and  place and holds, A/ROM composite flexion.  Attempted A/ROM PIP, and DIP flexion however pt is unable to perform. Pt was issued  yellow theraputty an instructed in use, he returned demonstration. Buddy strap applied to ring and small finger for AA/ROM composite finger flexion. Gripper set a level 3 to pick up 1 inch blocks while wearing buddy strap for sustained grip. Note sent with pt. To MD.             OT Education - 04/21/19 1601    Education Details  yellow putty exercises, use of buddy strap.    Person(s) Educated  Patient   GIRLFRIEND   Methods  Explanation;Demonstration    Comprehension  Verbalized understanding;Returned demonstration       OT Short Term Goals - 04/21/19 1054      OT SHORT TERM GOAL #1   Title  I wiuth initial HEP.    Time  6    Period  Weeks    Status  Achieved    Target Date  04/19/19      OT SHORT TERM GOAL #2   Title  I with splint wear care and precautions.    Time  6    Period  Weeks    Status  Achieved      OT SHORT TERM GOAL #3   Title  Pt will increase A/ROM MP flexion to 75*  for increased LUE functional use.    Time  6    Period  Weeks    Status  Achieved   90     OT SHORT TERM GOAL #4   Title  Pt will demonstrate at least 60* PIP flexion in prep for increased functional use.    Time  6    Period  Weeks    Status  Not Met        OT Long Term Goals - 04/21/19 1054      OT LONG TERM GOAL #1   Title  I with updated HEP.    Time  12    Period  Weeks    Status  New      OT LONG TERM GOAL #2   Title  Pt will demonstrate grip strength of at least 30 lbs for increased LUE functional use.    Time  12    Period  Weeks    Status  New   25 lbs without buddy strap, 40 lbs with buddy strap     OT LONG TERM GOAL #3   Title  Pt will demonstrate at least 90% composite flexion for LUE including 5th digit.    Baseline  inially only grossly 50% composite finger flexion,    Time  12    Period  Weeks    Status  On-going      OT LONG TERM GOAL #4   Title  Pt will resume use of LUE as a non dominant assist 90% of the time for ADLs/ IADLs with pain less  than or equal to 3/10    Time  12    Period  Weeks    Status  New            Plan - 04/21/19 1602    Clinical Impression Statement  Pt demonstrates limited progress towards goals. Pt has significant stiffness in 5th digit and minimal active movement at DIP, PIP. Note was sent with pt. to take to MD    Occupational performance deficits (Please refer to evaluation for details):  ADL's;IADL's;Work;Leisure;Social Participation    Body Structure / Function / Physical Skills  ADL;Edema;Balance;Endurance;Mobility;Strength;Flexibility;UE functional use;FMC;Pain;Coordination;Decreased knowledge of precautions;GMC;ROM;Decreased knowledge of use of DME;Dexterity;IADL    Rehab Potential  Good    OT Frequency  1x / week    OT Duration  12 weeks    OT Treatment/Interventions  Self-care/ADL training;Therapeutic exercise;Ultrasound;Neuromuscular education;Manual Therapy;Splinting;Therapeutic activities;Cryotherapy;Paraffin;DME and/or AE instruction;Fluidtherapy;Electrical Stimulation;Moist Heat;Contrast Bath;Passive range of motion;Patient/family education    Plan  continue A/ROM, P/ROM, and place and hold as able, strenghtening    Consulted and Agree with Plan of Care  Patient;Other (Comment)    Family Member Consulted  video interpreter       Patient will benefit from skilled therapeutic intervention in order to improve the following deficits and impairments:   Body Structure / Function / Physical Skills: ADL, Edema, Balance, Endurance, Mobility, Strength, Flexibility, UE functional use, FMC, Pain, Coordination, Decreased knowledge of precautions, GMC, ROM, Decreased knowledge of use of DME, Dexterity, IADL       Visit Diagnosis: Stiffness of left hand, not elsewhere classified  Pain in left hand  Muscle weakness (generalized)  Other lack of coordination    Problem List There are no active problems to display for this patient.   Angel Barajas 04/21/2019, 4:07 PM  [facility] [address] [place], [place], [address] Phone: [phone]   Fax:  [phone]  Name:  Angel Barajas MRN: [id number] Date of Birth: 08-17-1980

## 2019-04-27 ENCOUNTER — Ambulatory Visit: Payer: Self-pay | Admitting: Occupational Therapy

## 2019-07-19 ENCOUNTER — Encounter (HOSPITAL_BASED_OUTPATIENT_CLINIC_OR_DEPARTMENT_OTHER): Payer: Self-pay | Admitting: *Deleted

## 2019-07-19 ENCOUNTER — Other Ambulatory Visit: Payer: Self-pay

## 2019-07-22 ENCOUNTER — Other Ambulatory Visit (HOSPITAL_COMMUNITY)
Admission: RE | Admit: 2019-07-22 | Discharge: 2019-07-22 | Disposition: A | Discharge status: Home / Self Care | Payer: Self-pay | Source: Ambulatory Visit | Attending: Orthopedic Surgery | Admitting: Orthopedic Surgery

## 2019-07-22 DIAGNOSIS — Z20828 Contact with and (suspected) exposure to other viral communicable diseases: Secondary | ICD-10-CM | POA: Insufficient documentation

## 2019-07-22 DIAGNOSIS — Z01812 Encounter for preprocedural laboratory examination: Secondary | ICD-10-CM | POA: Insufficient documentation

## 2019-07-24 LAB — NOVEL CORONAVIRUS, NAA (HOSP ORDER, SEND-OUT TO REF LAB; TAT 18-24 HRS): SARS-CoV-2, NAA: NOT DETECTED

## 2019-07-26 ENCOUNTER — Ambulatory Visit (HOSPITAL_BASED_OUTPATIENT_CLINIC_OR_DEPARTMENT_OTHER): Admission: RE | Admit: 2019-07-26 | Payer: Self-pay | Source: Home / Self Care | Admitting: Orthopedic Surgery

## 2019-07-26 SURGERY — REMOVAL, HARDWARE
Anesthesia: Regional | Laterality: Left

## 2019-08-12 ENCOUNTER — Other Ambulatory Visit: Payer: Self-pay

## 2019-08-12 ENCOUNTER — Encounter: Payer: Self-pay | Admitting: Family Medicine

## 2019-08-12 ENCOUNTER — Ambulatory Visit (INDEPENDENT_AMBULATORY_CARE_PROVIDER_SITE_OTHER): Payer: Self-pay | Admitting: Family Medicine

## 2019-08-12 VITALS — BP 122/80 | HR 70 | Temp 98.3°F | Ht 66.0 in | Wt 240.0 lb

## 2019-08-12 DIAGNOSIS — M545 Low back pain, unspecified: Secondary | ICD-10-CM

## 2019-08-12 LAB — POCT URINALYSIS DIP (MANUAL ENTRY)
Bilirubin, UA: NEGATIVE
Blood, UA: NEGATIVE
Glucose, UA: NEGATIVE mg/dL
Ketones, POC UA: NEGATIVE mg/dL
Leukocytes, UA: NEGATIVE
Nitrite, UA: NEGATIVE
Protein Ur, POC: NEGATIVE mg/dL
Spec Grav, UA: 1.025 (ref 1.010–1.025)
Urobilinogen, UA: 0.2 E.U./dL
pH, UA: 7 (ref 5.0–8.0)

## 2019-08-12 MED ORDER — IBUPROFEN 600 MG PO TABS: 600.0000 mg | ORAL_TABLET | Freq: Three times a day (TID) | ORAL | 2 refills | Status: DC | PRN

## 2019-08-12 MED ORDER — CYCLOBENZAPRINE HCL 10 MG PO TABS: 10.0000 mg | ORAL_TABLET | Freq: Three times a day (TID) | ORAL | 2 refills | Status: DC | PRN

## 2019-08-12 NOTE — Patient Instructions (Addendum)
If you have lab work done today you will be contacted with your lab results within the next 2 weeks.  If you have not heard from us then please contact us. The fastest way to get your results is to register for My Chart.   IF you received an x-ray today, you will receive an invoice from [facility]. Please contact [facility] at [phone] with questions or concerns regarding your invoice.   IF you received labwork today, you will receive an invoice from [facility]. Please contact [facility] at [phone] with questions or concerns regarding your invoice.   Our billing staff will not be able to assist you with questions regarding bills from these companies.  You will be contacted with the lab results as soon as they are available. The fastest way to get your results is to activate your My Chart account. Instructions are located on the last page of this paperwork. If you have not heard from us regarding the results in 2 weeks, please contact this office.      Rehabilitacin de esguince o distensin de la parte inferior de la espalda Low Back Sprain or Strain Rehab Pregunte al mdico qu ejercicios son seguros para usted. Haga los ejercicios exactamente como se lo haya indicado el mdico y gradelos como se lo hayan indicado. Es normal sentir un estiramiento leve, tironeo, opresin o Dentistmalestar al Manpower Inchacer estos ejercicios. Detngase de inmediato si siente un dolor repentino o Community education officerel dolor empeora. No comience a hacer estos ejercicios hasta que se lo indique el mdico. Ejercicios de elongacin y amplitud de movimiento Estos ejercicios calientan los msculos y las articulaciones, y mejoran el movimiento y la flexibilidad de la espalda. Estos ejercicios tambin ayudan a Engineer, materialsaliviar el dolor, el adormecimiento y el hormigueo. Rotacin lumbar  1. Acustese boca arriba sobre un superficie firme y flexione las rodillas. 2. Coloque los brazos extendidos a los lados de modo que cada brazo  forme un ngulo de 90grados (ngulo recto) con respecto al cuerpo. 3. Mueva lentamente (rote) ambas rodillas hacia un lado del cuerpo hasta que sienta un estiramiento en la parte inferior de la espalda (zona lumbar). Trate de no levantar los hombros del piso. 4. Mantenga esta posicin durante __________ segundos. 5. Tensione los msculos abdominales y lentamente lleve las rodillas a la posicin inicial. 6. Repita este ejercicio del otro lado del cuerpo. Repita __________ veces. Realice este ejercicio __________ veces al da. Rodilla al pecho  1. Acustese boca arriba en una superficie firme con las piernas extendidas. 2. Flexione una rodilla. Tome la rodilla con las manos y llvela hacia el pecho hasta que sienta un estiramiento suave en la parte inferior de la espalda y las nalgas. ? Mantenga la pierna en esta posicin tomando la parte frontal de la rodilla. ? Mantenga la otra pierna lo ms extendida posible. 3. Mantenga esta posicin durante __________ segundos. 4. Vuelva lentamente a la posicin inicial. 5. Repita el ejercicio con la otra pierna. Repita __________ veces. Realice este ejercicio __________ veces al da. Extensin United Stationerssobre los codos, en decbito prono  1. Recustese boca abajo sobre una superficie firme (posicin prona). 2. Apyese sobre los codos. 3. Con los brazos, aydese a Haematologistlevantar el pecho hasta sentir un leve estiramiento en el abdomen y la parte inferior de la espalda. ? Arts administratorsto colocar algo de Autolivpeso corporal sobre los codos. Si no se siente cmodo, intente colocando almohadas debajo del pecho. ? Debe dejar la cadera inmvil sobre la superficie en la que  est apoyado. Mantenga la cadera y los msculos de la espalda relajados. 4. Mantenga esta posicin durante __________ segundos. 5. Afloje lentamente la parte superior del cuerpo y vuelva a la posicin inicial. Repita __________ veces. Realice este ejercicio __________ veces al da. Ejercicios de fortalecimiento Estos  ejercicios fortalecen la espalda y le otorgan resistencia. La resistencia es la capacidad de usar los msculos durante un tiempo prolongado, incluso despus de que se cansen. Inclinacin de la pelvis Este ejercicio fortalece los msculos que se encuentran en la parte profunda del abdomen. 1. Acustese boca arriba sobre una superficie firme. Doble las rodillas y [employer] pies planos sobre el piso. 2. Tensione los msculos abdominales. Eleve la pelvis hacia el techo y aplane la parte inferior de la espalda contra el suelo. ? Para realizar este ejercicio, puede colocar una toalla pequea debajo de la parte inferior de la espalda y presionar la espalda contra la toalla. 3. Mantenga esta posicin durante __________ segundos. 4. Relaje totalmente los msculos antes de repetir el ejercicio. Repita __________ veces. Realice este ejercicio __________ veces al da. Elevaciones alternadas de pierna y brazo  1. [place] manos y las rodillas sobre una superficie firme. Si est sobre un suelo duro, puede usar un elemento acolchado, como una alfombrilla para ejercicios, para apoyar las rodillas. 2. Alinee los brazos y las piernas. Las manos deben estar justo debajo de los hombros, y las rodillas debajo de la cadera. 3. Eleve la pierna izquierda hacia atrs. Al mismo tiempo, eleve el brazo derecho y Engineer, petroleum frente a usted. ? No eleve la pierna por encima de la cadera. ? No eleve el brazo por encima del hombro. ? Mantenga los msculos del abdomen y de la espalda contrados. ? Mantenga la cadera [employer] el suelo. ? No arquee la espalda. ? Mantenga el equilibrio con cuidado y no contenga la respiracin. 4. Mantenga esta posicin durante __________ segundos. 5. Vuelva lentamente a la posicin inicial. 6. Repita con su pierna derecha y su brazo izquierdo. Repita __________ veces. Realice este ejercicio __________ veces al da. Serie de abdominales con levantamiento de pierna  extendida  1. Acustese boca arriba sobre una superficie firme. 2. Flexione una rodilla y [place] la otra pierna extendida. 3. Tensione los msculos abdominales y levante la pierna extendida, a unas 4 o 6pulgadas (10 o 15cm) del suelo. 4. Mantenga apretados los msculos abdominales y sostenga esta posicin durante __________ segundos. ? No contenga la respiracin. ? No arquee la espalda. Mantngala plana contra el suelo. 5. Mantenga tensos los msculos abdominales mientras baja lentamente la pierna hasta la posicin inicial. 6. Repita el ejercicio con la otra pierna. Repita __________ veces. Realice este ejercicio __________ veces al da. Bajar una pierna con rodillas flexionadas 1. Acustese boca arriba sobre una superficie firme. 2. Apriete los msculos abdominales y [employer] Corporation del piso, uno a la vez, de modo que las rodillas y la cadera estn flexionadas en ngulos de 90grados (ngulos rectos). ? Las rodillas deben estar por encima de la cadera y las pantorrillas deben quedar paralelas al piso. 3. Con los msculos abdominales tensos y la rodilla flexionada, baje lentamente una pierna de modo que los dedos del pie toquen el suelo. 4. Levante la pierna para volver a la posicin inicial. ? No contenga la respiracin. ? No deje que la espalda se arquee. Mantenga la espalda plana contra el suelo. 5. Repita el ejercicio con la otra pierna. Repita __________ veces. Realice este ejercicio __________ veces al  da. Postura y [place] corporal La buena postura y la Administrator, sports corporal saludable pueden ayudar a Acupuncturist estrs en las articulaciones y los tejidos del cuerpo. La Water quality scientist se refiere a los movimientos y a las posiciones del cuerpo mientras realiza las actividades diarias. La postura es una parte de la Water quality scientist. Neomia Dear buena postura significa que:  La columna est en su posicin natural de curvatura en S (neutral).  Los hombros estn [employer] Auto.  La cabeza no est inclinada hacia adelante. Siga esas pautas para mejorar la postura y Regulatory affairs officer en sus actividades diarias. De pie   Al estar de pie, mantenga la columna en la posicin neutral y los pies separados al ancho de caderas, aproximadamente. Mantenga las rodillas ligeramente flexionadas. [place], los hombros y las caderas deben estar alineados.  Cuando realice una tarea en la que deba estar de pie en el mismo sitio durante mucho tiempo, coloque un pie en un objeto estable de 2 a 4pulgadas (5a 10cm) de alto, como un taburete. Esto ayuda a que la columna mantenga una posicin neutral. Sentado   Cuando est sentado, mantenga la columna en posicin neutral y deje los pies apoyados en el suelo. Use un apoyapis, si es necesario, y FedEx muslos paralelos al suelo. Evite redondear los hombros e inclinar la cabeza hacia adelante.  Cuando trabaje en un escritorio o con una computadora, el escritorio debe estar a una altura en la que las manos estn un poco ms abajo que los codos. Deslice la silla debajo del escritorio, de modo de estar lo suficientemente cerca como para mantener una buena [place].  Cuando trabaje con una computadora, coloque el monitor a una altura que le permita mirar derecho hacia adelante, sin tener que inclinar la cabeza hacia adelante o [place] atrs. Reposo  Al descansar o estar acostado, evite las posiciones que le causen ms dolor.  Si siente dolor al Kellogg que exigen sentarse, inclinarse, agacharse o ponerse en cuclillas, acustese en una posicin en la que el cuerpo no deba doblarse mucho. Por ejemplo, evite acurrucarse de costado con los brazos y las rodillas cerca del pecho (posicin fetal).  Si siente dolor con las actividades que exigen estar de pie durante mucho tiempo o Furniture conservator/restorer los brazos, acustese con la columna en una posicin neutral y flexione ligeramente las rodillas. Pruebe con las siguientes  posiciones: ? Teacher, music de costado con una almohada entre las rodillas. ? Acostarse boca arriba con una almohada debajo de las rodillas. Levantar objetos   Cuando tenga que levantar un objeto, mantenga los pies separados el ancho de los hombros, como [place], y apriete los msculos abdominales.  Flexione las rodillas y la cadera, y Dietitian la columna en posicin neutral. Es importante levantarse utilizando la fuerza de las piernas, no de la espalda. No trabe las rodillas hacia afuera.  Siempre pida ayuda a otra persona para levantar objetos pesados o incmodos. Esta informacin no tiene Theme park manager el consejo del mdico. Asegrese de hacerle al mdico cualquier pregunta que tenga. Document Revised: 09/23/2018 Document Reviewed: 09/23/2018 Elsevier Patient Education  2020 [employer].

## 2019-08-12 NOTE — Progress Notes (Signed)
12/31/20209:26 AM  Angel Barajas 05-Dec-1980, 38 y.o., male [id number]  Chief Complaint  Patient presents with  . Back Pain    back pain for the past 3 wks, denies falling, no urinary px. Taking no medication for the pain    HPI:   Patient is a 38 y.o. male  who presents today for right sided low back pain For past 3 weeks, does not radiate, constant, no numbness or tingling, changes in bowel or bladder function He has problems bending over Works in Data processing manager Has not tried anything for this yet Denies any trauma or falls He is right handed  Depression screen PHQ 2/9 08/12/2019  Decreased Interest 0  Down, Depressed, Hopeless 0  PHQ - 2 Score 0    Fall Risk  08/12/2019  Falls in the past year? 0  Number falls in past yr: 0  Injury with Fall? 0     No Known Allergies  Prior to Admission medications   Not on File    Past Medical History:  Diagnosis Date  . Medical history non-contributory     Past Surgical History:  Procedure Laterality Date  . APPENDECTOMY  2005  . OPEN REDUCTION INTERNAL FIXATION (ORIF) PROXIMAL PHALANX Left 02/10/2019   Procedure: Left small finger fracture open reduction and surgical fixation, surgery as indicated;  Surgeon: Bradly Bienenstock, MD;  Location: [facility];  Service: Orthopedics;  Laterality: Left;     Social History   Tobacco Use  . Smoking status: Never Smoker  . Smokeless tobacco: Never Used  Substance Use Topics  . Alcohol use: Not Currently    No family history on file.  ROS Per hpi  OBJECTIVE:  Today's Vitals   08/12/19 0927  BP: 122/80  Pulse: 70  Temp: 98.3 F (36.8 C)  SpO2: 97%  Weight: 240 lb (108.9 kg)  Height: 5\' 6"  (1.676 m)   Body mass index is 38.74 kg/m.   Physical Exam Vitals and nursing note reviewed.  Constitutional:      Appearance: He is well-developed.  HENT:     Head: Normocephalic and atraumatic.  Eyes:     Conjunctiva/sclera: Conjunctivae  normal.     Pupils: Pupils are equal, round, and reactive to light.  Pulmonary:     Effort: Pulmonary effort is normal.  Musculoskeletal:     Cervical back: Neck supple.     Lumbar back: Spasms and tenderness present. No bony tenderness. Normal range of motion. Negative right straight leg raise test and negative left straight leg raise test.  Skin:    General: Skin is warm and dry.  Neurological:     Mental Status: He is alert and oriented to person, place, and time.     Results for orders placed or performed in visit on 08/12/19 (from the past 24 hour(s))  POCT urine dipstick     Status: Normal   Collection Time: 08/12/19  9:24 AM  Result Value Ref Range   Color, UA yellow yellow   Clarity, UA clear clear   Glucose, UA negative negative mg/dL   Bilirubin, UA negative negative   Ketones, POC UA negative negative mg/dL   Spec Grav, UA 08/14/19 3.546 - 1.025   Blood, UA negative negative   pH, UA 7.0 5.0 - 8.0   Protein Ur, POC negative negative mg/dL   Urobilinogen, UA 0.2 0.2 or 1.0 E.U./dL   Nitrite, UA Negative Negative   Leukocytes, UA Negative Negative    No results  found.   ASSESSMENT and PLAN  1. Acute right-sided low back pain without sciatica Discussed supportive measures, new meds r/se/b and RTC precautions. Patient educational handout given.  Other orders - ibuprofen (ADVIL) 600 MG tablet; Take 1 tablet (600 mg total) by mouth every 8 (eight) hours as needed. - cyclobenzaprine (FLEXERIL) 10 MG tablet; Take 1 tablet (10 mg total) by mouth 3 (three) times daily as needed for muscle spasms.  Return if symptoms worsen or fail to improve.    Rutherford Guys, MD [facility] at [facility] [place], [place] [address] Ph.  [phone] Fax [phone]

## 2020-08-21 ENCOUNTER — Other Ambulatory Visit: Payer: Self-pay

## 2020-08-21 DIAGNOSIS — Z20822 Contact with and (suspected) exposure to covid-19: Secondary | ICD-10-CM

## 2020-08-24 LAB — NOVEL CORONAVIRUS, NAA: SARS-CoV-2, NAA: DETECTED — AB

## 2020-08-24 LAB — SARS-COV-2, NAA 2 DAY TAT

## 2020-08-25 ENCOUNTER — Telehealth: Payer: Self-pay

## 2020-08-25 NOTE — Telephone Encounter (Signed)
Called to discuss with patient about COVID-19 symptoms and the use of one of the available treatments for those with mild to moderate Covid symptoms and at a high risk of hospitalization.  Pt appears to qualify for outpatient treatment due to co-morbid conditions and/or a member of an at-risk group in accordance with the FDA Emergency Use Authorization.    Symptom onset: Unknown Vaccinated: Unknown Booster? Unknown Immunocompromised? Unknown Qualifiers: None  Unable to reach pt - Using Spanish interpreter, unable to reach pt.  Angel Barajas

## 2020-08-31 ENCOUNTER — Ambulatory Visit
Admission: EM | Admit: 2020-08-31 | Discharge: 2020-08-31 | Disposition: A | Discharge status: Home / Self Care | Payer: HRSA Program | Attending: Emergency Medicine | Admitting: Emergency Medicine

## 2020-08-31 ENCOUNTER — Emergency Department (HOSPITAL_COMMUNITY)
Admission: EM | Admit: 2020-08-31 | Discharge: 2020-08-31 | Disposition: A | Discharge status: Home / Self Care | Payer: HRSA Program | Attending: Emergency Medicine | Admitting: Emergency Medicine

## 2020-08-31 ENCOUNTER — Emergency Department (HOSPITAL_COMMUNITY): Payer: HRSA Program

## 2020-08-31 ENCOUNTER — Encounter: Payer: Self-pay | Admitting: Emergency Medicine

## 2020-08-31 ENCOUNTER — Other Ambulatory Visit: Payer: Self-pay

## 2020-08-31 DIAGNOSIS — T783XXA Angioneurotic edema, initial encounter: Secondary | ICD-10-CM | POA: Diagnosis not present

## 2020-08-31 DIAGNOSIS — R9431 Abnormal electrocardiogram [ECG] [EKG]: Secondary | ICD-10-CM

## 2020-08-31 DIAGNOSIS — U071 COVID-19: Secondary | ICD-10-CM

## 2020-08-31 DIAGNOSIS — R079 Chest pain, unspecified: Secondary | ICD-10-CM | POA: Diagnosis present

## 2020-08-31 DIAGNOSIS — T7840XA Allergy, unspecified, initial encounter: Secondary | ICD-10-CM | POA: Diagnosis not present

## 2020-08-31 DIAGNOSIS — R000 Tachycardia, unspecified: Secondary | ICD-10-CM | POA: Insufficient documentation

## 2020-08-31 LAB — TROPONIN I (HIGH SENSITIVITY)
Troponin I (High Sensitivity): 5 ng/L (ref <18)
Troponin I (High Sensitivity): 4 ng/L (ref <18)

## 2020-08-31 LAB — BASIC METABOLIC PANEL
Anion gap: 11 (ref 5–15)
BUN: 8 mg/dL (ref 6–20)
CO2: 24 mmol/L (ref 22–32)
Calcium: 9.2 mg/dL (ref 8.9–10.3)
Chloride: 103 mmol/L (ref 98–111)
Creatinine, Ser: 0.67 mg/dL (ref 0.61–1.24)
GFR, Estimated: >60 mL/min (ref >60)
Glucose, Bld: 100 mg/dL — ABNORMAL HIGH (ref 70–99)
Potassium: 3.9 mmol/L (ref 3.5–5.1)
Sodium: 138 mmol/L (ref 135–145)

## 2020-08-31 LAB — CBC
HCT: 47.6 % (ref 39.0–52.0)
Hemoglobin: 16.3 g/dL (ref 13.0–17.0)
MCH: 28.9 pg (ref 26.0–34.0)
MCHC: 34.2 g/dL (ref 30.0–36.0)
MCV: 84.4 fL (ref 80.0–100.0)
Platelets: 241 10*3/uL (ref 150–400)
RBC: 5.64 MIL/uL (ref 4.22–5.81)
RDW: 12.6 % (ref 11.5–15.5)
WBC: 8 10*3/uL (ref 4.0–10.5)
nRBC: 0 % (ref 0.0–0.2)

## 2020-08-31 MED ORDER — DIPHENHYDRAMINE HCL 50 MG/ML IJ SOLN
25.0000 mg | Freq: Once | INTRAMUSCULAR | Status: AC
  Administered 2020-08-31: 25 mg via INTRAVENOUS
  Filled 2020-08-31: qty 1

## 2020-08-31 MED ORDER — FAMOTIDINE IN NACL 20-0.9 MG/50ML-% IV SOLN
20.0000 mg | Freq: Once | INTRAVENOUS | Status: AC
  Administered 2020-08-31: 20 mg via INTRAVENOUS
  Filled 2020-08-31: qty 50

## 2020-08-31 MED ORDER — IOHEXOL 350 MG/ML SOLN
100.0000 mL | Freq: Once | INTRAVENOUS | Status: AC | PRN
  Administered 2020-08-31: 100 mL via INTRAVENOUS

## 2020-08-31 MED ORDER — DIPHENHYDRAMINE HCL 25 MG PO TABS: 25.0000 mg | ORAL_TABLET | Freq: Four times a day (QID) | ORAL | 0 refills | Status: AC | PRN

## 2020-08-31 MED ORDER — PREDNISONE 10 MG (21) PO TBPK: ORAL_TABLET | ORAL | 0 refills | Status: AC

## 2020-08-31 MED ORDER — METHYLPREDNISOLONE SODIUM SUCC 125 MG IJ SOLR
125.0000 mg | Freq: Once | INTRAMUSCULAR | Status: AC
  Administered 2020-08-31: 125 mg via INTRAVENOUS
  Filled 2020-08-31: qty 2

## 2020-08-31 NOTE — ED Notes (Signed)
ED Provider at bedside. 

## 2020-08-31 NOTE — ED Notes (Signed)
Patient in room speaking on personal cell at this time. Vitals stable. No acute distress noted at this time.

## 2020-08-31 NOTE — ED Provider Notes (Signed)
[facility]    CSN: [id number] Arrival date & time: 08/31/20  1024      History   Chief Complaint Chief Complaint  Patient presents with  . Chest Pain  . Oral Swelling  . COVID +    HPI Angel Barajas is a 40 y.o. male   History of obesity presenting for left-sided chest pain, bilateral feet swelling since this morning.  Denies pain radiation.  States right upper lip began swelling yesterday, left side a little today.  Left side has gone down, the right side persist.  Feels like there is a knot in his throat.  Denies lightheadedness, dizziness, shortness of breath.  Of note, per chart review patient is COVID positive-diagnosed 08/22/2020.  History of DVT, PE.  Past Medical History:  Diagnosis Date  . Medical history non-contributory     There are no problems to display for this patient.   Past Surgical History:  Procedure Laterality Date  . APPENDECTOMY  2005  . OPEN REDUCTION INTERNAL FIXATION (ORIF) PROXIMAL PHALANX Left 02/10/2019   Procedure: Left small finger fracture open reduction and surgical fixation, surgery as indicated;  Surgeon: Bradly Bienenstock, MD;  Location: [facility];  Service: Orthopedics;  Laterality: Left;        Home Medications    Prior to Admission medications   Not on File    Family History History reviewed. No pertinent family history.  Social History Social History   Tobacco Use  . Smoking status: Never Smoker  . Smokeless tobacco: Never Used  Vaping Use  . Vaping Use: Never used  Substance Use Topics  . Alcohol use: Not Currently  . Drug use: Never     Allergies   Patient has no known allergies.   Review of Systems Review of Systems  Constitutional: Negative for fatigue and fever.  HENT: Positive for facial swelling.   Respiratory: Negative for cough, shortness of breath and wheezing.   Cardiovascular: Positive for chest pain and leg swelling. Negative for palpitations.  Gastrointestinal:  Negative for abdominal pain, diarrhea and vomiting.  Musculoskeletal: Negative for arthralgias and myalgias.  Skin: Negative for rash and wound.  Neurological: Negative for speech difficulty and headaches.  All other systems reviewed and are negative.    Physical Exam Triage Vital Signs ED Triage Vitals  Enc Vitals Group     BP      Pulse      Resp      Temp      Temp src      SpO2      Weight      Height      Head Circumference      Peak Flow      Pain Score      Pain Loc      Pain Edu?      Excl. in GC?    No data found.  Updated Vital Signs BP (!) 142/88 (BP Location: Left Arm)   Pulse 95   Temp 98.7 F (37.1 C) (Oral)   Resp 18   SpO2 96%   Visual Acuity Right Eye Distance:   Left Eye Distance:   Bilateral Distance:    Right Eye Near:   Left Eye Near:    Bilateral Near:     Physical Exam Constitutional:      General: He is not in acute distress.    Appearance: He is well-developed. He is obese. He is not ill-appearing or diaphoretic.  HENT:  Head: Normocephalic and atraumatic.     Mouth/Throat:     Comments: Moderate upper lip swelling (R>L).  No obvious swelling of uvula, tongue, throat.  Airway patent. Eyes:     General: No scleral icterus.    Pupils: Pupils are equal, round, and reactive to light.  Neck:     Vascular: No JVD.     Trachea: No tracheal deviation.  Cardiovascular:     Rate and Rhythm: Normal rate.  Pulmonary:     Effort: Pulmonary effort is normal. No respiratory distress.     Breath sounds: No stridor.  Musculoskeletal:     Comments: Mild bilateral foot tenderness without pitting edema.  Negative calf tenderness, Homans' sign bilaterally.  Skin:    Capillary Refill: Capillary refill takes less than 2 seconds.     Coloration: Skin is not jaundiced or pale.     Findings: No erythema.  Neurological:     Mental Status: He is alert and oriented to person, place, and time.      UC Treatments / Results  Labs (all labs  ordered are listed, but only abnormal results are displayed) Labs Reviewed - No data to display  EKG   Radiology No results found.  Procedures Procedures (including critical care time)  Medications Ordered in UC Medications - No data to display  Initial Impression / Assessment and Plan / UC Course  I have reviewed the triage vital signs and the nursing notes.  Pertinent labs & imaging results that were available during my care of the patient were reviewed by me and considered in my medical decision making (see chart for details).     Afebrile, nontoxic, hemodynamically stable.  No hypoxia or acute respiratory distress.  EKG done in office, reviewed by me without previous to compare: NSR with ventricular rate of 88 bpm.  No QTC prolongation.  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  Given history of obesity, self-reported history of "almost having a heart attack" and acute COVID-19 infection which increases hypercoagulable state, recommended patient go to ER for further evaluation management.  Patient declining transport via EMS.  Elected to transport and personal vehicle in stable condition.  Discussed with/benefits of refusing transport.  Patient verbalized understanding.  Interpreter services via video present throughout visit. Final Clinical Impressions(s) / UC Diagnoses   Final diagnoses:  Chest pain, unspecified type  Abnormal EKG  Angioedema, initial encounter  COVID-19 virus infection   Discharge Instructions   None    ED Prescriptions    None     PDMP not reviewed this encounter.   [place], [place], [place] 08/31/20 1114

## 2020-08-31 NOTE — ED Triage Notes (Signed)
Pt c/o lt sided chest pain with bilateral feet swelling since this am, denies pain radiating. Pt c/o lips swelling since yesterday, today feels like a knot in his throat. Denies SOB. No distress noted at this time. Engineer, structural used. EKG done on arrival. Pt is COVID positive day 6.

## 2020-08-31 NOTE — ED Provider Notes (Signed)
[facility] EMERGENCY DEPARTMENT Provider Note  CSN: [id number] Arrival date & time: 08/31/20 1131    History Chief Complaint  Patient presents with  . Chest Pain    HPI History via Spanish Language interpreter Angel Barajas is a 40 y.o. male with no PMH reports he tested positive for Covid on 1/11 but has not had any symptoms. He reports yesterday he noted some lip swelling and then today began having some swelling in his hands and feet along with itching. He had an episode of moderate aching chest pain this morning, not associated with SOB that has since resolved. He has not had a fever or SOB. He was seen at [facility] and sent to the ED for evaluation of ACS/PE. He does not take any medications. No new soaps, lotions, detergents, or foods.    Past Medical History:  Diagnosis Date  . Medical history non-contributory     Past Surgical History:  Procedure Laterality Date  . APPENDECTOMY  2005  . OPEN REDUCTION INTERNAL FIXATION (ORIF) PROXIMAL PHALANX Left 02/10/2019   Procedure: Left small finger fracture open reduction and surgical fixation, surgery as indicated;  Surgeon: Bradly Bienenstock, MD;  Location: [facility];  Service: Orthopedics;  Laterality: Left;     No family history on file.  Social History   Tobacco Use  . Smoking status: Never Smoker  . Smokeless tobacco: Never Used  Vaping Use  . Vaping Use: Never used  Substance Use Topics  . Alcohol use: Not Currently  . Drug use: Never     Home Medications Prior to Admission medications   Medication Sig Start Date End Date Taking? Authorizing Provider  diphenhydrAMINE (BENADRYL) 25 MG tablet Take 1 tablet (25 mg total) by mouth every 6 (six) hours as needed. 08/31/20  Yes Pollyann Savoy, MD  predniSONE (STERAPRED UNI-PAK 21 TAB) 10 MG (21) TBPK tablet 10mg  Tabs, 6 day taper. Use as directed 08/31/20  Yes 09/02/20, MD     Allergies    Patient has no known allergies.   Review of  Systems   Review of Systems A comprehensive review of systems was completed and negative except as noted in HPI.    Physical Exam BP 128/90   Pulse (!) 104   Temp 98.2 F (36.8 C) (Oral)   Resp (!) 21   Ht 5\' 10"  (1.778 m)   Wt 111.1 kg   SpO2 97%   BMI 35.15 kg/m   Physical Exam Vitals and nursing note reviewed.  Constitutional:      Appearance: Normal appearance.  HENT:     Head: Normocephalic and atraumatic.     Nose: Nose normal.     Mouth/Throat:     Mouth: Mucous membranes are moist.     Comments: Mild angioedema of upper lip, no tongue swelling Eyes:     Extraocular Movements: Extraocular movements intact.     Conjunctiva/sclera: Conjunctivae normal.  Cardiovascular:     Rate and Rhythm: Tachycardia present.  Pulmonary:     Effort: Pulmonary effort is normal.     Breath sounds: Normal breath sounds. No stridor. No wheezing or rhonchi.  Abdominal:     General: Abdomen is flat.     Palpations: Abdomen is soft.     Tenderness: There is no abdominal tenderness.  Musculoskeletal:        General: No swelling. Normal range of motion.     Cervical back: Neck supple.     Comments: Mild soft  tissue swelling of hands and feet, no pitting edema  Skin:    General: Skin is warm and dry.     Findings: No rash.  Neurological:     General: No focal deficit present.     Mental Status: He is alert.  Psychiatric:        Mood and Affect: Mood normal.      ED Results / Procedures / Treatments   Labs (all labs ordered are listed, but only abnormal results are displayed) Labs Reviewed  BASIC METABOLIC PANEL - Abnormal; Notable for the following components:      Result Value   Glucose, Bld 100 (*)    All other components within normal limits  CBC  TROPONIN I (HIGH SENSITIVITY)  TROPONIN I (HIGH SENSITIVITY)    EKG None  Radiology DG Chest 2 View  Result Date: 08/31/2020 CLINICAL DATA:  Chest pain and shortness of breath. EXAM: CHEST - 2 VIEW COMPARISON:  None.  FINDINGS: The cardiac silhouette, mediastinal and hilar contours are within normal limits. Low lung volumes with vascular crowding and streaky atelectasis. No infiltrates, effusions or edema. The bony thorax is intact. IMPRESSION: Low lung volumes with vascular crowding and streaky atelectasis. Electronically Signed   By: Rudie Meyer M.D.   On: 08/31/2020 12:36   CT Angio Chest PE W/Cm &/Or Wo Cm  Result Date: 08/31/2020 CLINICAL DATA:  Chest pain, lip swelling, COVID positive EXAM: CT ANGIOGRAPHY CHEST WITH CONTRAST TECHNIQUE: Multidetector CT imaging of the chest was performed using the standard protocol during bolus administration of intravenous contrast. Multiplanar CT image reconstructions and MIPs were obtained to evaluate the vascular anatomy. CONTRAST:  OMNIPAQUE IOHEXOL 350 MG/ML SOLN COMPARISON:  Radiograph 08/31/2020 FINDINGS: Cardiovascular: Satisfactory opacification the pulmonary arteries to the segmental level. No pulmonary artery filling defects are identified. Central pulmonary arteries are normal caliber. Normal heart size. No pericardial effusion. Suboptimal aortic opacification. No gross acute luminal abnormality of the aorta. Normal caliber aorta. Normal 3 vessel branching of the aortic arch with unremarkable appearance of the great vessels. No major venous abnormalities are seen. Mediastinum/Nodes: No mediastinal fluid or gas. Normal thyroid gland and thoracic inlet. No acute abnormality of the trachea or esophagus. No worrisome mediastinal, hilar or axillary adenopathy. Lungs/Pleura: There are few patchy and nodular/masslike regions of mixed ground-glass and consolidation in the posterior right and left lower lobes. Largest foci measuring up to 13 mm. Mild diffuse airways thickening is noted as well. No pneumothorax or visible pleural effusion. Upper Abdomen: Hepatic hypoattenuation may be related to contrast timing. No acute abnormalities present in the visualized portions of the  upper abdomen. Musculoskeletal: No acute osseous abnormality or suspicious osseous lesion. No worrisome chest wall masses or lesions. Review of the MIP images confirms the above findings. IMPRESSION: 1. No evidence of pulmonary embolism. 2. Few patchy and nodular/masslike regions of mixed ground-glass and consolidation in the posterior right and left lower lobes. Findings are favored to represent an infectious or inflammatory process including potential COVID-19 pneumonia. Regardless, follow-up imaging should be obtained. Follow-up non-contrast CT recommended at 3-6 months to confirm persistence. If unchanged, and solid component remains <6 mm, annual CT is recommended until 5 years of stability has been established. If persistent these nodules should be considered highly suspicious if the solid component of the nodule is 6 mm or greater in size and enlarging. This recommendation follows the consensus statement: Guidelines for Management of Incidental Pulmonary Nodules Detected on CT Images: From the Fleischner Society 2017; Radiology  2017; 497:026-378. Electronically Signed   By: Kreg Shropshire M.D.   On: 08/31/2020 23:07    Procedures Procedures  Medications Ordered in the ED Medications  methylPREDNISolone sodium succinate (SOLU-MEDROL) 125 mg/2 mL injection 125 mg (125 mg Intravenous Given 08/31/20 2133)  diphenhydrAMINE (BENADRYL) injection 25 mg (25 mg Intravenous Given 08/31/20 2134)  famotidine (PEPCID) IVPB 20 mg premix (0 mg Intravenous Stopped 08/31/20 2208)  iohexol (OMNIPAQUE) 350 MG/ML injection 100 mL (100 mLs Intravenous Contrast Given 08/31/20 2300)     MDM Rules/Calculators/A&P MDM Patient's labs reviewed from triage, no concerning findings. He is mildly tachycardic with recent diagnosis of Covid. Will give steroids, benadryl and pepcid for allergic reaction and send for CTA to rule out PE in the meantime.   ED Course  I have reviewed the triage vital signs and the nursing  notes.  Pertinent labs & imaging results that were available during my care of the patient were reviewed by me and considered in my medical decision making (see chart for details).  Clinical Course as of 08/31/20 2321  Thu Aug 31, 2020  2315 Reviewed CT results with the patient. Even though he reports little Covid symptoms, his CT is concerning for covid pneumonia. Regardless he is well appearing, not hypoxic and not having chest pain now. Swelling seems to have improved. Plan discharge with prednisone, benadryl and PCP follow up. He was encouraged to return for any worsening symptoms.  [CS]    Clinical Course User Index [CS] Pollyann Savoy, MD    Final Clinical Impression(s) / ED Diagnoses Final diagnoses:  COVID-19  Allergic reaction, initial encounter    Rx / DC Orders ED Discharge Orders         Ordered    predniSONE (STERAPRED UNI-PAK 21 TAB) 10 MG (21) TBPK tablet        08/31/20 2318    diphenhydrAMINE (BENADRYL) 25 MG tablet  Every 6 hours PRN        08/31/20 2318           Pollyann Savoy, MD 08/31/20 2321

## 2020-08-31 NOTE — ED Notes (Signed)
Patient ambulatory back to room from waiting room, NAD noted.

## 2020-08-31 NOTE — ED Notes (Signed)
Patient in CT

## 2020-08-31 NOTE — ED Triage Notes (Signed)
Pt is here today due to cp & bilateral feet swelling this morning. Pt is alsop c/o lip swelling starting yesterday. Pt is spanish speaking.

## 2020-08-31 NOTE — ED Notes (Signed)
Patient back from CT.

## 2020-09-06 ENCOUNTER — Other Ambulatory Visit: Payer: Self-pay

## 2020-09-06 ENCOUNTER — Emergency Department
Admission: EM | Admit: 2020-09-06 | Discharge: 2020-09-06 | Disposition: A | Discharge status: Home / Self Care | Payer: Self-pay | Attending: Emergency Medicine | Admitting: Emergency Medicine

## 2020-09-06 ENCOUNTER — Emergency Department: Payer: Self-pay

## 2020-09-06 DIAGNOSIS — R7401 Elevation of levels of liver transaminase levels: Secondary | ICD-10-CM | POA: Insufficient documentation

## 2020-09-06 DIAGNOSIS — R1011 Right upper quadrant pain: Secondary | ICD-10-CM | POA: Insufficient documentation

## 2020-09-06 DIAGNOSIS — T391X1A Poisoning by 4-Aminophenol derivatives, accidental (unintentional), initial encounter: Secondary | ICD-10-CM | POA: Insufficient documentation

## 2020-09-06 LAB — URINALYSIS, COMPLETE (UACMP) WITH MICROSCOPIC
Bilirubin Urine: NEGATIVE
Glucose, UA: NEGATIVE mg/dL
Hgb urine dipstick: NEGATIVE
Ketones, ur: NEGATIVE mg/dL
Leukocytes,Ua: NEGATIVE
Nitrite: NEGATIVE
Protein, ur: NEGATIVE mg/dL
Specific Gravity, Urine: 1.015 (ref 1.005–1.030)
pH: 8 (ref 5.0–8.0)

## 2020-09-06 LAB — COMPREHENSIVE METABOLIC PANEL
ALT: 64 U/L — ABNORMAL HIGH (ref 0–44)
AST: 43 U/L — ABNORMAL HIGH (ref 15–41)
Albumin: 4.2 g/dL (ref 3.5–5.0)
Alkaline Phosphatase: 76 U/L (ref 38–126)
Anion gap: 11 (ref 5–15)
BUN: 14 mg/dL (ref 6–20)
CO2: 25 mmol/L (ref 22–32)
Calcium: 9.3 mg/dL (ref 8.9–10.3)
Chloride: 99 mmol/L (ref 98–111)
Creatinine, Ser: 0.59 mg/dL — ABNORMAL LOW (ref 0.61–1.24)
GFR, Estimated: >60 mL/min (ref >60)
Glucose, Bld: 116 mg/dL — ABNORMAL HIGH (ref 70–99)
Potassium: 4 mmol/L (ref 3.5–5.1)
Sodium: 135 mmol/L (ref 135–145)
Total Bilirubin: 0.7 mg/dL (ref 0.3–1.2)
Total Protein: 7.7 g/dL (ref 6.5–8.1)

## 2020-09-06 LAB — CBC
HCT: 45.2 % (ref 39.0–52.0)
Hemoglobin: 16.1 g/dL (ref 13.0–17.0)
MCH: 29.2 pg (ref 26.0–34.0)
MCHC: 35.6 g/dL (ref 30.0–36.0)
MCV: 81.9 fL (ref 80.0–100.0)
Platelets: 272 10*3/uL (ref 150–400)
RBC: 5.52 MIL/uL (ref 4.22–5.81)
RDW: 12.4 % (ref 11.5–15.5)
WBC: 8.9 10*3/uL (ref 4.0–10.5)
nRBC: 0 % (ref 0.0–0.2)

## 2020-09-06 LAB — LIPASE, BLOOD: Lipase: 34 U/L (ref 11–51)

## 2020-09-06 MED ORDER — IOHEXOL 300 MG/ML  SOLN
100.0000 mL | Freq: Once | INTRAMUSCULAR | Status: AC | PRN
  Administered 2020-09-06: 100 mL via INTRAVENOUS
  Filled 2020-09-06: qty 100

## 2020-09-06 NOTE — ED Triage Notes (Signed)
Right side abdominal pain started today.

## 2020-09-06 NOTE — ED Provider Notes (Signed)
[facility] Emergency Department Provider Note   ____________________________________________   Event Date/Time   First MD Initiated Contact with Patient 09/06/20 1354     (approximate)  I have reviewed the triage vital signs and the nursing notes.   HISTORY  Chief Complaint No chief complaint on file.    HPI Angel Barajas is a 40 y.o. male with no stated past medical history presents for right upper quadrant abdominal pain that has been hurting since this morning, is 9/10 in severity, is nonradiating, and has no exacerbating or relieving factors.  Patient denies any associated nausea/vomiting/diarrhea.  Of note, patient states that he has been using more Tylenol than usual over the past 3-4 days for some chronic headaches.  Patient currently denies any vision changes, tinnitus, difficulty speaking, facial droop, sore throat, chest pain, shortness of breath, nausea/vomiting/diarrhea, dysuria, or weakness/numbness/paresthesias in any extremity            Past Medical History:  Diagnosis Date  . Medical history non-contributory     There are no problems to display for this patient.   Past Surgical History:  Procedure Laterality Date  . APPENDECTOMY  2005  . OPEN REDUCTION INTERNAL FIXATION (ORIF) PROXIMAL PHALANX Left 02/10/2019   Procedure: Left small finger fracture open reduction and surgical fixation, surgery as indicated;  Surgeon: Bradly Bienenstock, MD;  Location: [facility];  Service: Orthopedics;  Laterality: Left;     Prior to Admission medications   Medication Sig Start Date End Date Taking? Authorizing Provider  diphenhydrAMINE (BENADRYL) 25 MG tablet Take 1 tablet (25 mg total) by mouth every 6 (six) hours as needed. 08/31/20   Pollyann Savoy, MD  predniSONE (STERAPRED UNI-PAK 21 TAB) 10 MG (21) TBPK tablet 10mg  Tabs, 6 day taper. Use as directed 08/31/20   09/02/20, MD    Allergies Patient has no known  allergies.  No family history on file.  Social History Social History   Tobacco Use  . Smoking status: Never Smoker  . Smokeless tobacco: Never Used  Vaping Use  . Vaping Use: Never used  Substance Use Topics  . Alcohol use: Not Currently  . Drug use: Never    Review of Systems Constitutional: No fever/chills Eyes: No visual changes. ENT: No sore throat. Cardiovascular: Denies chest pain. Respiratory: Denies shortness of breath. Gastrointestinal: Endorses right upper quadrant abdominal pain.  No nausea, no vomiting.  No diarrhea. Genitourinary: Negative for dysuria. Musculoskeletal: Negative for acute arthralgias Skin: Negative for rash. Neurological: Negative for headaches, weakness/numbness/paresthesias in any extremity Psychiatric: Negative for suicidal ideation/homicidal ideation   ____________________________________________   PHYSICAL EXAM:  VITAL SIGNS: ED Triage Vitals  Enc Vitals Group     BP 09/06/20 1106 127/83     Pulse Rate 09/06/20 1106 85     Resp 09/06/20 1106 20     Temp 09/06/20 1106 98.7 F (37.1 C)     Temp Source 09/06/20 1106 Oral     SpO2 09/06/20 1106 95 %     Weight 09/06/20 1106 245 lb (111.1 kg)     Height 09/06/20 1106 5\' 10"  (1.778 m)     Head Circumference --      Peak Flow --      Pain Score 09/06/20 1119 9     Pain Loc --      Pain Edu? --      Excl. in GC? --    Constitutional: Alert and oriented. Well appearing and in  no acute distress. Eyes: Conjunctivae are normal. PERRL. Head: Atraumatic. Nose: No congestion/rhinnorhea. Mouth/Throat: Mucous membranes are moist. Neck: No stridor Cardiovascular: Grossly normal heart sounds.  Good peripheral circulation. Respiratory: Normal respiratory effort.  No retractions. Gastrointestinal: Soft and nontender. No distention. Musculoskeletal: No obvious deformities Neurologic:  Normal speech and language. No gross focal neurologic deficits are appreciated. Skin:  Skin is warm and  dry. No rash noted. Psychiatric: Mood and affect are normal. Speech and behavior are normal.  ____________________________________________   LABS (all labs ordered are listed, but only abnormal results are displayed)  Labs Reviewed  COMPREHENSIVE METABOLIC PANEL - Abnormal; Notable for the following components:      Result Value   Glucose, Bld 116 (*)    Creatinine, Ser 0.59 (*)    AST 43 (*)    ALT 64 (*)    All other components within normal limits  URINALYSIS, COMPLETE (UACMP) WITH MICROSCOPIC - Abnormal; Notable for the following components:   Color, Urine YELLOW (*)    APPearance HAZY (*)    Bacteria, UA RARE (*)    All other components within normal limits  LIPASE, BLOOD  CBC    RADIOLOGY  ED MD interpretation: CT of the abdomen and pelvis  Official radiology report(s): CT Abdomen Pelvis W Contrast  Result Date: 09/06/2020 CLINICAL DATA:  Flank pain.  Kidney stone suspected EXAM: CT ABDOMEN AND PELVIS WITH CONTRAST TECHNIQUE: Multidetector CT imaging of the abdomen and pelvis was performed using the standard protocol following bolus administration of intravenous contrast. CONTRAST:  OMNIPAQUE IOHEXOL 300 MG/ML  SOLN COMPARISON:  None. FINDINGS: Lower chest: Lung bases are clear. Hepatobiliary: No focal hepatic lesion. No biliary duct dilatation. Common bile duct is normal. Pancreas: Pancreas is normal. No ductal dilatation. No pancreatic inflammation. Spleen: Normal spleen Adrenals/urinary tract: Adrenal glands normal. No nephrolithiasis or ureterolithiasis. No obstructive uropathy. Bladder normal. Stomach/Bowel: Stomach, duodenum small bowel are normal. Terminal ileum and cecum normal. The colon and rectosigmoid colon are normal. Vascular/Lymphatic: Abdominal aorta is normal caliber. No periportal or retroperitoneal adenopathy. No pelvic adenopathy. Reproductive: Prostate unremarkable Other: No free fluid. Musculoskeletal: No aggressive osseous lesion. IMPRESSION: 1. No  explanation for RIGHT-sided pain. 2. No ureterolithiasis or obstructive uropathy. 3. Post appendectomy. Electronically Signed   By: Genevive Bi M.D.   On: 09/06/2020 15:47    ____________________________________________   PROCEDURES  Procedure(s) performed (including Critical Care):  .1-3 Lead EKG Interpretation Performed by: Merwyn Katos, MD Authorized by: Merwyn Katos, MD     Interpretation: normal     ECG rate:  82   ECG rate assessment: normal     Rhythm: sinus rhythm     Ectopy: none     Conduction: normal       ____________________________________________   INITIAL IMPRESSION / ASSESSMENT AND PLAN / ED COURSE  As part of my medical decision making, I reviewed the following data within the electronic MEDICAL RECORD NUMBER Nursing notes reviewed and incorporated, Labs reviewed, EKG interpreted, Old chart reviewed, Radiograph reviewed and Notes from prior ED visits reviewed and incorporated        Patient presents for abdominal pain.  Differential diagnosis includes appendicitis, abdominal aortic aneurysm, surgical biliary disease, pancreatitis, SBO, mesenteric ischemia, serious intra-abdominal bacterial illness, genital torsion. Doubt atypical ACS. Based on history, physical exam, radiologic/laboratory evaluation, there is no red flag results or symptomatology requiring emergent intervention or need for admission at this time.  Patient does show mildly elevated transaminitis concerning for possible mild  Tylenol overdose and may account for patient's right upper quadrant pain.  Patient was encouraged to discontinue any Tylenol and alcohol for the next 3-4 months as well as follow-up with his primary care physician in the next 2-3 weeks to check for resolution of transaminitis Pt tolerating PO. Disposition: Patient will be discharged with strict return precautions and follow up with primary MD within 12-24 hours for further evaluation. Patient understands that this  still may have an early presentation of an emergent medical condition such as appendicitis that will require a recheck.      ____________________________________________   FINAL CLINICAL IMPRESSION(S) / ED DIAGNOSES  Final diagnoses:  Right upper quadrant abdominal pain  Transaminitis  Acetaminophen toxicity, accidental or unintentional, initial encounter     ED Discharge Orders    None       Note:  This document was prepared using Dragon voice recognition software and may include unintentional dictation errors.   Merwyn Katos, MD 09/06/20 [phone]

## 2020-09-06 NOTE — ED Notes (Signed)
ED Provider at bedside. 

## 2021-07-10 ENCOUNTER — Emergency Department (HOSPITAL_COMMUNITY)
Admission: EM | Admit: 2021-07-10 | Discharge: 2021-07-10 | Disposition: A | Discharge status: Home / Self Care | Payer: Self-pay | Attending: Emergency Medicine | Admitting: Emergency Medicine

## 2021-07-10 ENCOUNTER — Encounter (HOSPITAL_COMMUNITY): Payer: Self-pay

## 2021-07-10 DIAGNOSIS — F17210 Nicotine dependence, cigarettes, uncomplicated: Secondary | ICD-10-CM | POA: Insufficient documentation

## 2021-07-10 DIAGNOSIS — J101 Influenza due to other identified influenza virus with other respiratory manifestations: Secondary | ICD-10-CM | POA: Insufficient documentation

## 2021-07-10 DIAGNOSIS — Z20822 Contact with and (suspected) exposure to covid-19: Secondary | ICD-10-CM | POA: Insufficient documentation

## 2021-07-10 DIAGNOSIS — R000 Tachycardia, unspecified: Secondary | ICD-10-CM | POA: Insufficient documentation

## 2021-07-10 LAB — RESP PANEL BY RT-PCR (FLU A&B, COVID) ARPGX2
Influenza A by PCR: POSITIVE — AB
Influenza B by PCR: NEGATIVE
SARS Coronavirus 2 by RT PCR: NEGATIVE

## 2021-07-10 MED ORDER — OSELTAMIVIR PHOSPHATE 75 MG PO CAPS: 75.0000 mg | ORAL_CAPSULE | Freq: Two times a day (BID) | ORAL | 0 refills | Status: AC

## 2021-07-10 MED ORDER — ACETAMINOPHEN 500 MG PO TABS: 1000.0000 mg | ORAL_TABLET | Freq: Once | ORAL | Status: DC

## 2021-07-10 NOTE — ED Provider Notes (Signed)
[facility]-EMERGENCY DEPT Provider Note   CSN: [id number] Arrival date & time: 07/10/21  7425     History Chief Complaint  Patient presents with   Fever   Headache   Generalized Body Aches   Nausea    Angel Barajas is a 40 y.o. male.   Fever Associated symptoms: headaches, myalgias, nausea and vomiting   Associated symptoms: no chest pain, no congestion and no sore throat   Headache Associated symptoms: fever, myalgias, nausea and vomiting   Associated symptoms: no congestion and no sore throat    Patient presents with viral symptoms for 3 days.  His symptoms include headache, generalized body aches, nausea, fever.  He is COVID vaccinated x2, no flu vaccine.  Reports his symptoms started acutely after Thanksgiving, they have been intermittent and alleviated by Tylenol at home.  His father and wife at home are sick with similar symptoms.  Denies any known aggravating factors, is also having a cough.  He had 2 episodes of posttussive emesis yesterday.  No chest pain or abdominal pain.  Past Medical History:  Diagnosis Date   Medical history non-contributory     There are no problems to display for this patient.   Past Surgical History:  Procedure Laterality Date   APPENDECTOMY  2005   OPEN REDUCTION INTERNAL FIXATION (ORIF) PROXIMAL PHALANX Left 02/10/2019   Procedure: Left small finger fracture open reduction and surgical fixation, surgery as indicated;  Surgeon: Bradly Bienenstock, MD;  Location: [facility];  Service: Orthopedics;  Laterality: Left;        Family History  Family history unknown: Yes    Social History   Tobacco Use   Smoking status: Some Days    Types: Cigarettes   Smokeless tobacco: Never  Vaping Use   Vaping Use: Never used  Substance Use Topics   Alcohol use: Not Currently   Drug use: Never    Home Medications Prior to Admission medications   Medication Sig Start Date End Date Taking? Authorizing  Provider  diphenhydrAMINE (BENADRYL) 25 MG tablet Take 1 tablet (25 mg total) by mouth every 6 (six) hours as needed. 08/31/20   Pollyann Savoy, MD  predniSONE (STERAPRED UNI-PAK 21 TAB) 10 MG (21) TBPK tablet 10mg  Tabs, 6 day taper. Use as directed 08/31/20   09/02/20, MD    Allergies    Patient has no known allergies.  Review of Systems   Review of Systems  Constitutional:  Positive for fever.  HENT:  Negative for congestion and sore throat.   Respiratory:  Negative for shortness of breath.   Cardiovascular:  Negative for chest pain.  Gastrointestinal:  Positive for nausea and vomiting.  Musculoskeletal:  Positive for myalgias.  Neurological:  Positive for headaches.   Physical Exam Updated Vital Signs BP (!) 132/92   Pulse (!) 105   Temp 98.7 F (37.1 C) (Oral)   Resp 18   Ht 5\' 11"  (1.803 m)   Wt 113.4 kg   SpO2 96%   BMI 34.87 kg/m   Physical Exam Vitals and nursing note reviewed. Exam conducted with a chaperone present.  Constitutional:      Appearance: Normal appearance.  HENT:     Head: Normocephalic.     Nose: No congestion.     Mouth/Throat:     Pharynx: No posterior oropharyngeal erythema.  Eyes:     Extraocular Movements: Extraocular movements intact.     Pupils: Pupils are equal, round, and reactive  to light.  Cardiovascular:     Rate and Rhythm: Regular rhythm. Tachycardia present.  Pulmonary:     Effort: Pulmonary effort is normal.     Breath sounds: Normal breath sounds.     Comments: Lungs CTA bilaterally. No accessory muscle use. Speaking in complete sentences.  Abdominal:     General: Abdomen is flat.     Palpations: Abdomen is soft.  Musculoskeletal:     Cervical back: Normal range of motion.  Neurological:     Mental Status: He is alert.  Psychiatric:        Mood and Affect: Mood normal.   ED Results / Procedures / Treatments   Labs (all labs ordered are listed, but only abnormal results are displayed) Labs Reviewed  RESP  PANEL BY RT-PCR (FLU A&B, COVID) [id number]    EKG None  Radiology No results found.  Procedures Procedures   Medications Ordered in ED Medications  acetaminophen (TYLENOL) tablet 1,000 mg (has no administration in time range)    ED Course  I have reviewed the triage vital signs and the nursing notes.  Pertinent labs & imaging results that were available during my care of the patient were reviewed by me and considered in my medical decision making (see chart for details).    MDM Rules/Calculators/A&P                           Patient is mildly tachycardic, vitals are otherwise stable.  Patient is flu positive, will prescribe oseltamivir and have him follow-up with PCP as needed.  Patient discharged in stable condition.  No signs of respiratory distress. No hypoxia or tachycardia. Lungs CTA bilaterally. Doubt underlying cardiopulmonary process.  I considered, but think unlikely, dangerous causes of this patient's symptoms to include ACS, CHF or COPD exacerbations, pneumonia, pneumothorax.  Patient is nontoxic appearing and not in need of emergent medical intervention.    Final Clinical Impression(s) / ED Diagnoses Final diagnoses:  None    Rx / DC Orders ED Discharge Orders     None        Theron Arista, Cordelia Poche 07/10/21 1724    Wynetta Fines, MD 07/15/21 2255

## 2021-07-10 NOTE — Discharge Instructions (Signed)
Take the antiviral medicine every 12 hours until the bottle is empty. Drink plenty of fluids and stay well-hydrated. Return if things change or worsen.

## 2021-07-10 NOTE — ED Triage Notes (Signed)
Patient c/o fever, headache, body aches, and nausea x 3 days.

## 2022-01-08 ENCOUNTER — Emergency Department (HOSPITAL_COMMUNITY): Admission: EM | Admit: 2022-01-08 | Payer: Self-pay | Source: Home / Self Care

## 2022-01-08 ENCOUNTER — Encounter (HOSPITAL_COMMUNITY): Payer: Self-pay

## 2022-01-08 ENCOUNTER — Other Ambulatory Visit: Payer: Self-pay

## 2022-01-08 ENCOUNTER — Emergency Department (HOSPITAL_COMMUNITY): Payer: Self-pay

## 2022-01-08 ENCOUNTER — Emergency Department (HOSPITAL_COMMUNITY)
Admission: EM | Admit: 2022-01-08 | Discharge: 2022-01-08 | Disposition: A | Discharge status: Home / Self Care | Payer: Self-pay | Attending: Emergency Medicine | Admitting: Emergency Medicine

## 2022-01-08 DIAGNOSIS — M25532 Pain in left wrist: Secondary | ICD-10-CM | POA: Insufficient documentation

## 2022-01-08 DIAGNOSIS — X500XXA Overexertion from strenuous movement or load, initial encounter: Secondary | ICD-10-CM | POA: Insufficient documentation

## 2022-01-08 MED ORDER — ACETAMINOPHEN 325 MG PO TABS
650.0000 mg | ORAL_TABLET | Freq: Once | ORAL | Status: AC
  Administered 2022-01-08: 650 mg via ORAL
  Filled 2022-01-08: qty 2

## 2022-01-08 NOTE — ED Triage Notes (Signed)
Patient states he was moving heavy object last night when left wrist bent backwards, causing pain. Denies OTC meds this AM

## 2022-01-08 NOTE — ED Provider Notes (Addendum)
[facility]-EMERGENCY DEPT Provider Note   CSN: [id number] Arrival date & time: 01/08/22  0915     History  Chief Complaint  Patient presents with   Wrist Pain    Angel Barajas Angel Barajas is a 41 y.o. male.  Patient with no pertinent past medical history presents today with complaints of left wrist pain.  He states that yesterday he was moving a heavy piece of furniture and felt his wrist bend backwards with immediate pain in the wrist. He states that he has been having trouble moving his wrist today due to pain.  The history is provided by the patient. A language interpreter was used.  Wrist Pain      Home Medications Prior to Admission medications   Medication Sig Start Date End Date Taking? Authorizing Provider  diphenhydrAMINE (BENADRYL) 25 MG tablet Take 1 tablet (25 mg total) by mouth every 6 (six) hours as needed. 08/31/20   Pollyann Savoy, MD  oseltamivir (TAMIFLU) 75 MG capsule Take 1 capsule (75 mg total) by mouth every 12 (twelve) hours. 07/10/21   Theron Arista, PA-C  predniSONE (STERAPRED UNI-PAK 21 TAB) 10 MG (21) TBPK tablet 10mg  Tabs, 6 day taper. Use as directed 08/31/20   09/02/20, MD      Allergies    Patient has no known allergies.    Review of Systems   Review of Systems  Musculoskeletal:  Positive for arthralgias.  All other systems reviewed and are negative.  Physical Exam Updated Vital Signs BP (!) 136/102 (BP Location: Right Arm)   Pulse 80   Temp 98.6 F (37 C) (Oral)   Resp 16   Ht 5\' 11"  (1.803 m)   Wt 113.4 kg   BMI 34.87 kg/m  Physical Exam Vitals and nursing note reviewed.  Constitutional:      General: He is not in acute distress.    Appearance: Normal appearance. He is normal weight. He is not ill-appearing, toxic-appearing or diaphoretic.  HENT:     Head: Normocephalic and atraumatic.  Cardiovascular:     Rate and Rhythm: Normal rate.  Pulmonary:     Effort: Pulmonary effort is normal. No  respiratory distress.  Musculoskeletal:        General: Normal range of motion.     Cervical back: Normal range of motion.     Comments: Tenderness noted to palpation of the anatomical snuffbox of the left wrist. 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 Otherwise patient has full ROM to the wrist with some pain specifically with flexion and thumb movements. Mild swelling noted without obvious deformity. Radial pulse 2+, distal sensation intact, capillary refill less than 2 seconds.  Skin:    General: Skin is warm and dry.  Neurological:     General: No focal deficit present.     Mental Status: He is alert.  Psychiatric:        Mood and Affect: Mood normal.        Behavior: Behavior normal.    ED Results / Procedures / Treatments   Labs (all labs ordered are listed, but only abnormal results are displayed) Labs Reviewed - No data to display  EKG None  Radiology DG Wrist Complete Left  Result Date: 01/08/2022 CLINICAL DATA:  Left wrist pain after injury last night EXAM: LEFT WRIST - COMPLETE 3+ VIEW COMPARISON:  None Available. FINDINGS: Intact appearing fixation  hardware partially visualized in the proximal phalanx left fifth finger. No osseous fracture or dislocation in the left wrist. No suspicious focal osseous lesions. No significant arthropathy. IMPRESSION: No fracture or dislocation in the left wrist. Electronically Signed   By: Delbert Phenix M.D.   On: 01/08/2022 09:52    Procedures Procedures    Medications Ordered in ED Medications - No data to display  ED Course/ Medical Decision Making/ A&P                           Medical Decision Making Amount and/or Complexity of Data Reviewed Radiology: ordered.  Risk OTC drugs.   Patient presents today with complaints of left wrist pain following injury yesterday. He is afebrile, non-toxic appearing, and in no acute distress with  reassuring vital signs. X-ray imaging of the left wrist obtained and is negative for obvious fracture or dislocation.  I have personally reviewed and evaluated this imaging and agree with radiology interpretation.  Pain managed in ED. Given patient's snuffbox tenderness, patient placed in thumb spica splint for management and advised to follow up with orthopedics if symptoms persist for possibility of missed scaphoid fracture diagnosis. Conservative therapy with RICE recommended and discussed. Patient is stable for discharge at this time, educated on red flag symptoms that would prompt immediate return. Discharged in stable condition.   Final Clinical Impression(s) / ED Diagnoses Final diagnoses:  Left wrist pain    Rx / DC Orders ED Discharge Orders     None     An After Visit Summary was printed and given to the patient.     Vear Clock 01/08/22 1102    Radhika Dershem, Shawn Route, PA-C 01/08/22 1103    Virgina Norfolk, DO 01/08/22 1324

## 2022-01-08 NOTE — Discharge Instructions (Signed)
Como discutimos, su evaluacin en la sala de emergencias fue tranquilizadora para las [address] Las imgenes de rayos X de su mueca no mostraron ninguna fractura o dislocacin. Dada la ubicacin de Agricultural consultant, lo he colocado en una mueca que inmoviliza su pulgar para apoyar su lesin. Tambin le he dado una derivacin a ortopedia con un nmero para llamar para programar una cita en los prximos das si contina con Chief Technology Officer. Mientras tanto, recomiende reposo, hielo, compresin y elevacin de su lesin con Tylenol/ibuprofeno segn sea necesario para Chief Technology Officer.  Regrese si se desarrollan sntomas nuevos o que empeoran.

## 2022-08-19 ENCOUNTER — Encounter (HOSPITAL_COMMUNITY): Payer: Self-pay

## 2022-08-19 ENCOUNTER — Other Ambulatory Visit: Payer: Self-pay

## 2022-08-19 ENCOUNTER — Emergency Department (HOSPITAL_COMMUNITY)
Admission: EM | Admit: 2022-08-19 | Discharge: 2022-08-19 | Disposition: A | Discharge status: Home / Self Care | Payer: PRIVATE HEALTH INSURANCE | Attending: Emergency Medicine | Admitting: Emergency Medicine

## 2022-08-19 DIAGNOSIS — H9201 Otalgia, right ear: Secondary | ICD-10-CM | POA: Diagnosis not present

## 2022-08-19 DIAGNOSIS — R220 Localized swelling, mass and lump, head: Secondary | ICD-10-CM | POA: Diagnosis present

## 2022-08-19 DIAGNOSIS — K047 Periapical abscess without sinus: Secondary | ICD-10-CM | POA: Diagnosis not present

## 2022-08-19 MED ORDER — AMOXICILLIN-POT CLAVULANATE 875-125 MG PO TABS: 1.0000 | ORAL_TABLET | Freq: Two times a day (BID) | ORAL | 0 refills | Status: AC

## 2022-08-19 MED ORDER — AMOXICILLIN-POT CLAVULANATE 875-125 MG PO TABS
1.0000 | ORAL_TABLET | Freq: Once | ORAL | Status: AC
  Administered 2022-08-19: 1 via ORAL
  Filled 2022-08-19: qty 1

## 2022-08-19 NOTE — Discharge Instructions (Addendum)
Take augmentin

## 2022-08-19 NOTE — ED Triage Notes (Signed)
Pt reports right side ear pain and swelling to right side neck under ear. Pt reports sore throat as well. Pt reports pain when turning head to left side. Pt denies fevers or headache.

## 2022-08-19 NOTE — ED Provider Notes (Signed)
[facility] DEPT Provider Note   CSN: [id number] Arrival date & time: 08/19/22  1941     History  Chief Complaint  Patient presents with   Facial Swelling   Otalgia    Angel Barajas is a 42 y.o. male.   Otalgia    Patient presents due to right-sided facial pain/ear pain.  Started acutely this morning, feels like his face is slightly swollen but he is not having any swelling to the back of his throat.  Denies any difficulty swallowing or pain with swallowing with me.  No fevers or chills.  States he had a dental procedure on the left side of his jaw 2 weeks ago but the pain is on the right side.  Home Medications Prior to Admission medications   Medication Sig Start Date End Date Taking? Authorizing Provider  diphenhydrAMINE (BENADRYL) 25 MG tablet Take 1 tablet (25 mg total) by mouth every 6 (six) hours as needed. 08/31/20   Truddie Hidden, MD  oseltamivir (TAMIFLU) 75 MG capsule Take 1 capsule (75 mg total) by mouth every 12 (twelve) hours. 07/10/21   Sherrill Raring, PA-C  predniSONE (STERAPRED UNI-PAK 21 TAB) 10 MG (21) TBPK tablet 10mg  Tabs, 6 day taper. Use as directed 08/31/20   Truddie Hidden, MD      Allergies    Patient has no known allergies.    Review of Systems   Review of Systems  HENT:  Positive for ear pain.     Physical Exam Updated Vital Signs BP (!) 166/93   Pulse 93   Temp 98.1 F (36.7 C) (Oral)   Resp 18   Ht 5\' 9"  (1.753 m)   Wt 108.9 kg   SpO2 100%   BMI 35.44 kg/m  Physical Exam Vitals and nursing note reviewed. Exam conducted with a chaperone present.  Constitutional:      General: He is not in acute distress.    Appearance: Normal appearance.  HENT:     Head: Normocephalic and atraumatic.      Comments: Scaling to the posterior auricle, TMs clear bilaterally with visible ossicles    Mouth/Throat:     Comments: No sublingual tenderness or swelling.  Uvula is midline, normal phonation and  protecting airway.  No tenderness to percussion of dentition Eyes:     General: No scleral icterus.    Extraocular Movements: Extraocular movements intact.     Pupils: Pupils are equal, round, and reactive to light.  Skin:    Coloration: Skin is not jaundiced.  Neurological:     Mental Status: He is alert. Mental status is at baseline.     Coordination: Coordination normal.     ED Results / Procedures / Treatments   Labs (all labs ordered are listed, but only abnormal results are displayed) Labs Reviewed - No data to display  EKG None  Radiology No results found.  Procedures Procedures    Medications Ordered in ED Medications - No data to display  ED Course/ Medical Decision Making/ A&P                           Medical Decision Making Risk Prescription drug management.   Patient presents due to right-sided jaw pain.  Patient is afebrile, does not meet any SIRS criteria.  Is tender to palpation, I do not appreciate any obvious fluctuance or crepitus.  There is no uvular swelling or edema, uvula is midline and no  change in phonation.  I do not think this is Ludwig angina, retropharyngeal abscess or peritonsillar abscess.  There is no tenderness to percussion, lower suspicion for dental abscess although deeper infection was considered.  Possible developing infection - Will cover with antibiotics and have him follow-up with dentist in the next 48 hours.  Patient will return if he has significant facial swelling, difficulty swallowing or any airway changes in the next 24 hours.  Return precautions were discussed with patient and significant other who translated.        Final Clinical Impression(s) / ED Diagnoses Final diagnoses:  None    Rx / DC Orders ED Discharge Orders     None         Theron Arista, PA-C 08/19/22 2210    Rolan Bucco, MD 08/19/22 2223

## 2022-10-12 IMAGING — DX DG WRIST COMPLETE 3+V*L*
4 series · 4 of 4 positions shown · non-contrast
Comparison: None Available.

CLINICAL DATA: Left wrist pain after injury last night

EXAM:
LEFT WRIST - COMPLETE 3+ VIEW

[wrist ap (1 of 2)]
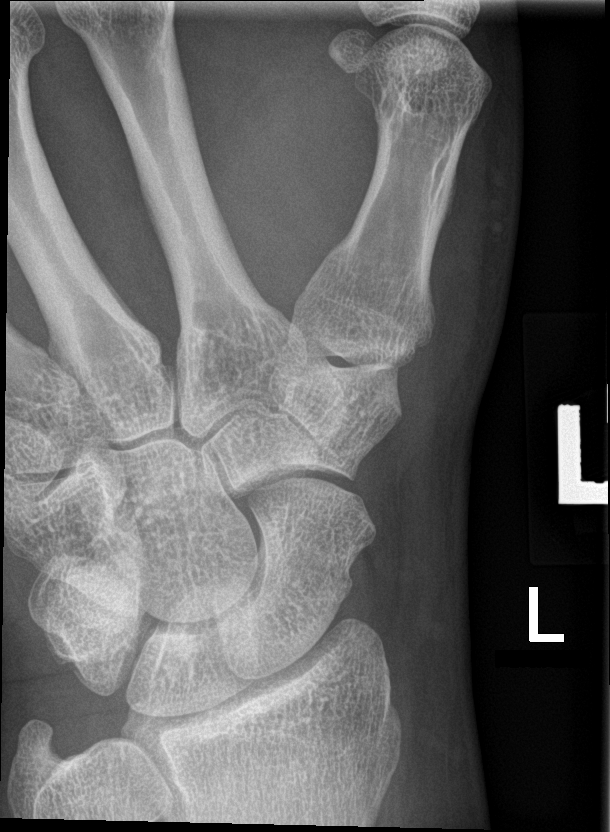

[wrist obl]
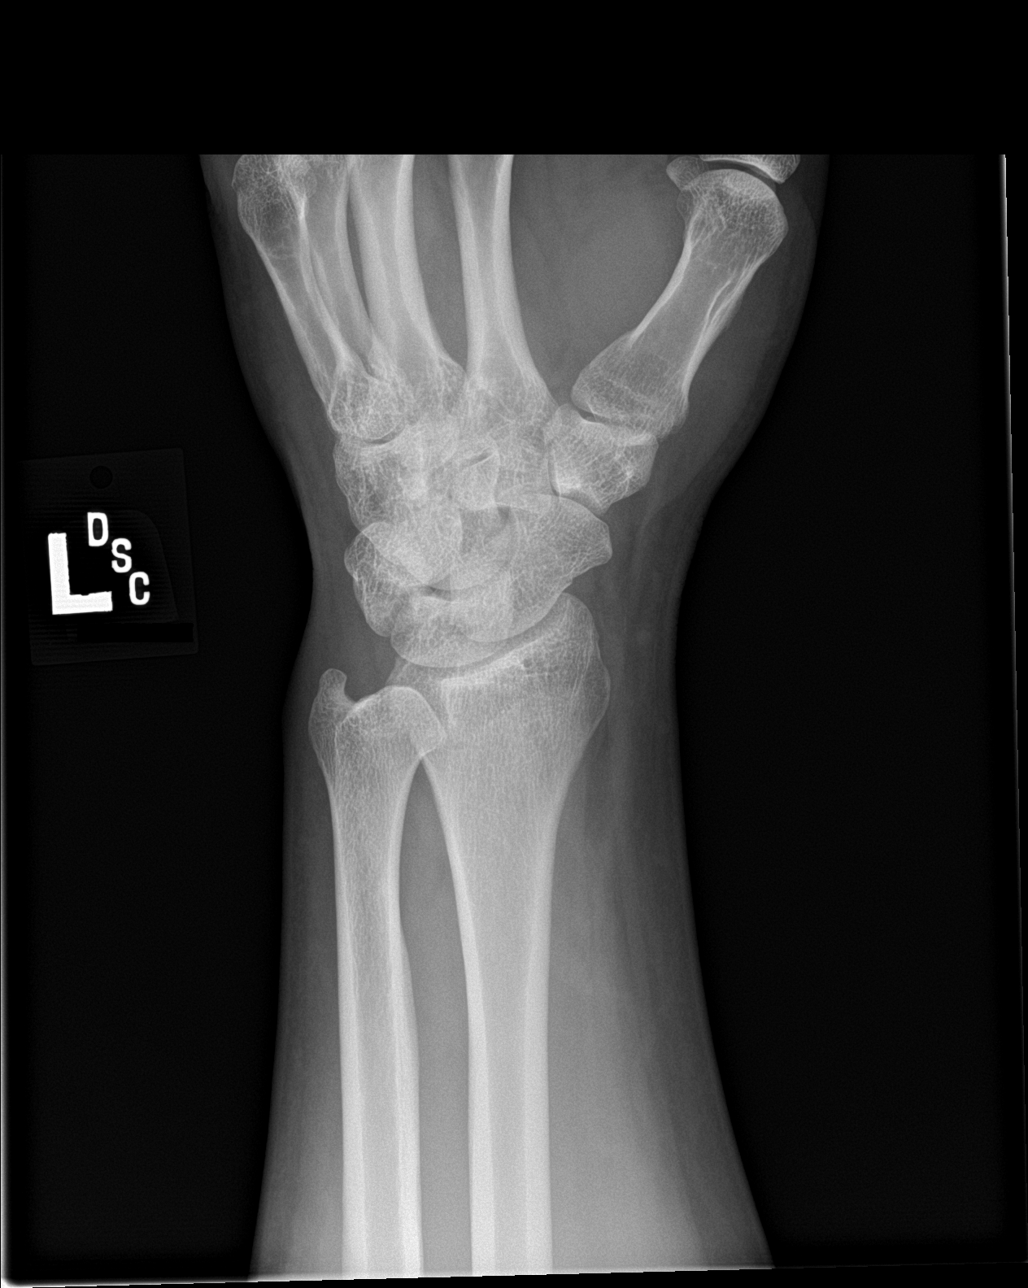

[wrist lat]
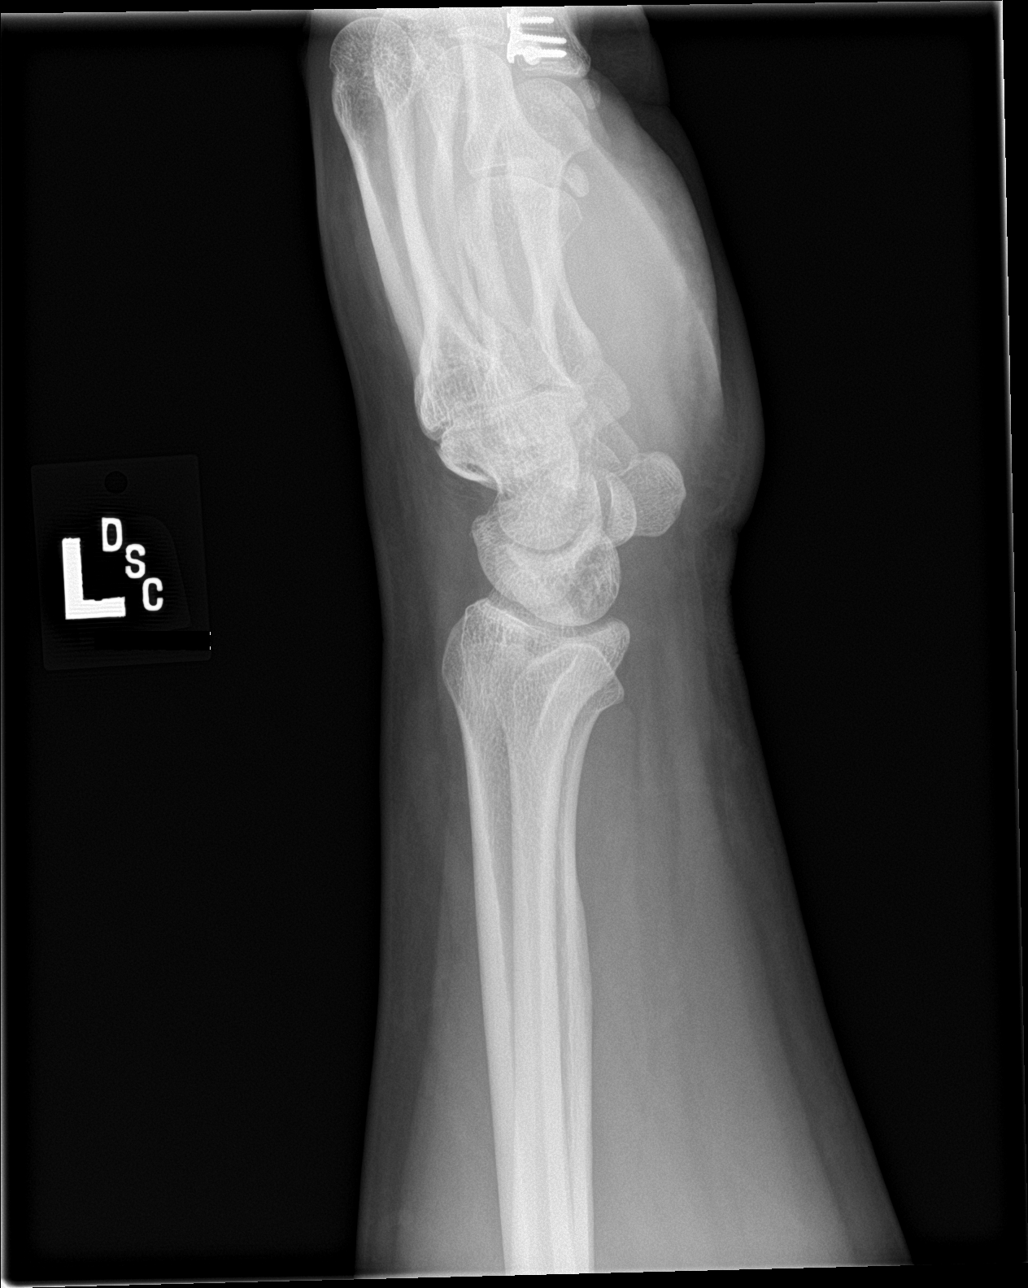

[wrist ap (2 of 2)]
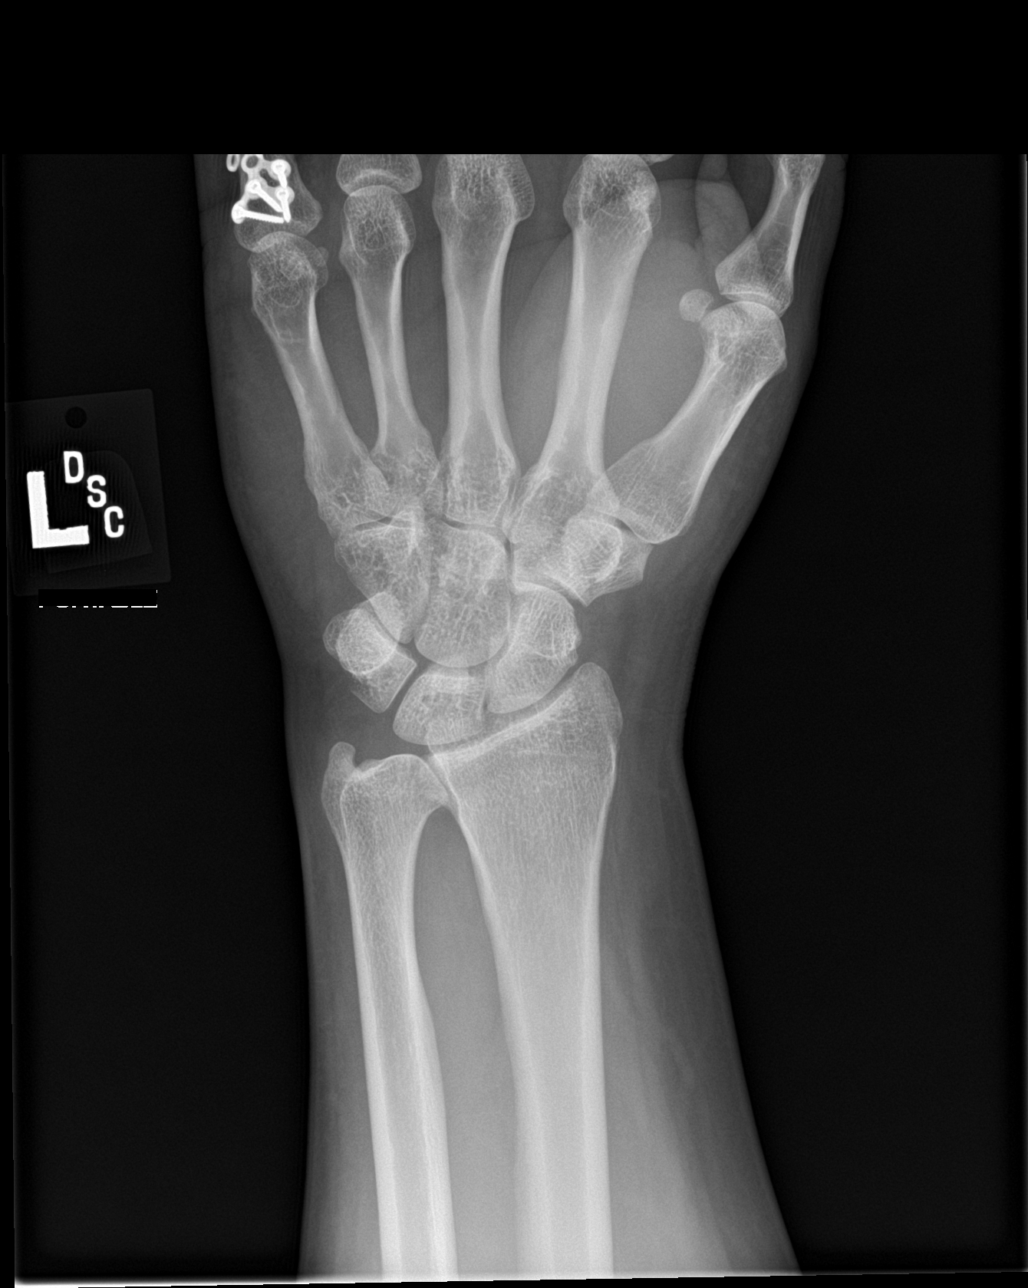

[4 of 4 positions shown; findings below may reference images not displayed]

FINDINGS: Intact appearing fixation hardware partially visualized in the
proximal phalanx left fifth finger. No osseous fracture or
dislocation in the left wrist. No suspicious focal osseous lesions.
No significant arthropathy.
IMPRESSION: No fracture or dislocation in the left wrist.

## 2024-03-05 ENCOUNTER — Emergency Department (HOSPITAL_COMMUNITY)

## 2024-03-05 ENCOUNTER — Emergency Department (HOSPITAL_COMMUNITY): Admission: EM | Admit: 2024-03-05 | Discharge: 2024-03-06 | Disposition: A | Discharge status: Home / Self Care

## 2024-03-05 ENCOUNTER — Other Ambulatory Visit: Payer: Self-pay

## 2024-03-05 ENCOUNTER — Encounter (HOSPITAL_COMMUNITY): Payer: Self-pay | Admitting: Emergency Medicine

## 2024-03-05 DIAGNOSIS — D72829 Elevated white blood cell count, unspecified: Secondary | ICD-10-CM | POA: Diagnosis not present

## 2024-03-05 DIAGNOSIS — R0789 Other chest pain: Secondary | ICD-10-CM | POA: Insufficient documentation

## 2024-03-05 DIAGNOSIS — R079 Chest pain, unspecified: Secondary | ICD-10-CM

## 2024-03-05 DIAGNOSIS — E119 Type 2 diabetes mellitus without complications: Secondary | ICD-10-CM | POA: Diagnosis not present

## 2024-03-05 DIAGNOSIS — R200 Anesthesia of skin: Secondary | ICD-10-CM | POA: Insufficient documentation

## 2024-03-05 DIAGNOSIS — R519 Headache, unspecified: Secondary | ICD-10-CM | POA: Diagnosis not present

## 2024-03-05 DIAGNOSIS — R42 Dizziness and giddiness: Secondary | ICD-10-CM | POA: Insufficient documentation

## 2024-03-05 LAB — CBC
HCT: 46.4 % (ref 39.0–52.0)
Hemoglobin: 15.5 g/dL (ref 13.0–17.0)
MCH: 27.8 pg (ref 26.0–34.0)
MCHC: 33.4 g/dL (ref 30.0–36.0)
MCV: 83.2 fL (ref 80.0–100.0)
Platelets: 230 K/uL (ref 150–400)
RBC: 5.58 MIL/uL (ref 4.22–5.81)
RDW: 12.4 % (ref 11.5–15.5)
WBC: 12.1 K/uL — ABNORMAL HIGH (ref 4.0–10.5)
nRBC: 0 % (ref 0.0–0.2)

## 2024-03-05 LAB — BASIC METABOLIC PANEL WITH GFR
Anion gap: 13 (ref 5–15)
BUN: 11 mg/dL (ref 6–20)
CO2: 22 mmol/L (ref 22–32)
Calcium: 9.8 mg/dL (ref 8.9–10.3)
Chloride: 104 mmol/L (ref 98–111)
Creatinine, Ser: 0.76 mg/dL (ref 0.61–1.24)
GFR, Estimated: >60 mL/min (ref >60)
Glucose, Bld: 138 mg/dL — ABNORMAL HIGH (ref 70–99)
Potassium: 4 mmol/L (ref 3.5–5.1)
Sodium: 139 mmol/L (ref 135–145)

## 2024-03-05 LAB — D-DIMER, QUANTITATIVE: D-Dimer, Quant: <0.27 ug{FEU}/mL (ref 0.00–0.50)

## 2024-03-05 LAB — TROPONIN I (HIGH SENSITIVITY)
Troponin I (High Sensitivity): 3 ng/L (ref <18)
Troponin I (High Sensitivity): 3 ng/L (ref <18)

## 2024-03-05 MED ORDER — ACETAMINOPHEN 325 MG PO TABS
650.0000 mg | ORAL_TABLET | Freq: Once | ORAL | Status: AC
Start: 2024-03-05 — End: 2024-03-05
  Administered 2024-03-05: 650 mg via ORAL
  Filled 2024-03-05: qty 2

## 2024-03-05 MED ORDER — ALUM & MAG HYDROXIDE-SIMETH 200-200-20 MG/5ML PO SUSP
15.0000 mL | Freq: Once | ORAL | Status: AC
  Administered 2024-03-05: 15 mL via ORAL
  Filled 2024-03-05: qty 30

## 2024-03-05 MED ORDER — FAMOTIDINE 20 MG PO TABS
20.0000 mg | ORAL_TABLET | Freq: Once | ORAL | Status: AC
  Administered 2024-03-05: 20 mg via ORAL
  Filled 2024-03-05: qty 1

## 2024-03-05 MED ORDER — SODIUM CHLORIDE 0.9 % IV BOLUS
1000.0000 mL | Freq: Once | INTRAVENOUS | Status: AC
  Administered 2024-03-05: 1000 mL via INTRAVENOUS

## 2024-03-05 NOTE — ED Provider Notes (Signed)
 [place] EMERGENCY DEPARTMENT AT [facility] Provider Note   CSN: [id number] Arrival date & time: 03/05/24  1918     Patient presents with: Chest Pain and Headache   Angel Barajas is a 43 y.o. male who presents to the emergency department with a chief complaint of chest pain and headache.  Patient states that he had a sudden onset of chest pain at approximately 11:30 AM followed by dizziness.  Patient states that the chest pain episode lasted approximately 3 hours.  He now states that the chest pain is mostly worn off.  Denies fever, chills, shortness of breath, abdominal pain, nausea, vomiting.  Patient states that he has had episodes of chest pain like this ever since 2013 where he had a heart attack, however I cannot see any documentation of this.  He also states that he believes he had a DVT in the past, but denies PE.  Denies blood thinning medication.  Patient denies currently seeing a cardiologist. States that his only at home medications are diabetic meds.     Chest Pain Associated symptoms: headache   Headache      Prior to Admission medications   Medication Sig Start Date End Date Taking? Authorizing Provider  amoxicillin -clavulanate (AUGMENTIN ) 875-125 MG tablet Take 1 tablet by mouth every 12 (twelve) hours. 08/19/22   Emelia Sluder, PA-C  diphenhydrAMINE  (BENADRYL ) 25 MG tablet Take 1 tablet (25 mg total) by mouth every 6 (six) hours as needed. 08/31/20   Roselyn Carlin NOVAK, MD  oseltamivir  (TAMIFLU ) 75 MG capsule Take 1 capsule (75 mg total) by mouth every 12 (twelve) hours. 07/10/21   Emelia Sluder, PA-C  predniSONE  (STERAPRED UNI-PAK 21 TAB) 10 MG (21) TBPK tablet 10mg  Tabs, 6 day taper. Use as directed 08/31/20   Roselyn Carlin NOVAK, MD    Allergies: Metformin    Review of Systems  Cardiovascular:  Positive for chest pain.  Neurological:  Positive for headaches.    Updated Vital Signs BP 121/83 (BP Location: Left Arm)   Pulse 88   Temp 98.1 F  (36.7 C) (Oral)   Resp 16   SpO2 99%   Physical Exam Vitals and nursing note reviewed.  Constitutional:      General: He is awake. He is not in acute distress.    Appearance: He is well-developed. He is not ill-appearing, toxic-appearing or diaphoretic.  HENT:     Head: Normocephalic and atraumatic.     Comments: Patient appreciates numbness to touch over L cheek area when compared to R of face  Eyes:     General: No visual field deficit or scleral icterus.    Extraocular Movements: Extraocular movements intact.     Right eye: Normal extraocular motion and no nystagmus.     Left eye: Normal extraocular motion and no nystagmus.     Pupils: Pupils are equal, round, and reactive to light. Pupils are equal.  Cardiovascular:     Rate and Rhythm: Normal rate and regular rhythm.     Heart sounds: Normal heart sounds.  Pulmonary:     Effort: Pulmonary effort is normal. No tachypnea, accessory muscle usage or respiratory distress.     Breath sounds: Normal breath sounds. No wheezing, rhonchi or rales.  Chest:     Chest wall: No tenderness.  Musculoskeletal:        General: Normal range of motion.     Cervical back: Normal range of motion.     Right lower leg: No edema.  Left lower leg: No edema.  Skin:    General: Skin is warm and dry.     Capillary Refill: Capillary refill takes less than 2 seconds.  Neurological:     General: No focal deficit present.     Mental Status: He is alert and oriented to person, place, and time.     GCS: GCS eye subscore is 4. GCS verbal subscore is 5. GCS motor subscore is 6.     Cranial Nerves: No dysarthria or facial asymmetry.     Sensory: No sensory deficit.     Motor: No weakness.     Coordination: Coordination normal.     Gait: Gait normal.  Psychiatric:        Mood and Affect: Mood normal.        Behavior: Behavior normal. Behavior is cooperative.     (all labs ordered are listed, but only abnormal results are displayed) Labs Reviewed   BASIC METABOLIC PANEL WITH GFR - Abnormal; Notable for the following components:      Result Value   Glucose, Bld 138 (*)    All other components within normal limits  CBC - Abnormal; Notable for the following components:   WBC 12.1 (*)    All other components within normal limits  D-DIMER, QUANTITATIVE  TROPONIN I (HIGH SENSITIVITY)  TROPONIN I (HIGH SENSITIVITY)    EKG: EKG Interpretation Date/Time:  Friday March 05 2024 19:33:57 EDT Ventricular Rate:  97 PR Interval:  163 QRS Duration:  100 QT Interval:  343 QTC Calculation: 436 R Axis:   233  Text Interpretation: Sinus rhythm Consider left atrial enlargement Abnormal R-wave progression, late transition ST elev, probable normal early repol pattern No previous EKG for comparison Confirmed by Gennaro Bouchard ([id number]) on 03/05/2024 8:26:22 PM  Radiology: DG Chest 2 View Result Date: 03/05/2024 CLINICAL DATA:  Chest pain EXAM: CHEST - 2 VIEW COMPARISON:  08/31/2020 FINDINGS: Heart and mediastinal contours are within normal limits. No focal opacities or effusions. No acute bony abnormality. IMPRESSION: No active cardiopulmonary disease. Electronically Signed   By: Franky Crease M.D.   On: 03/05/2024 20:07     Procedures   Medications Ordered in the ED  alum & mag hydroxide-simeth (MAALOX/MYLANTA) 200-200-20 MG/5ML suspension 15 mL (15 mLs Oral Given 03/05/24 2042)  famotidine  (PEPCID ) tablet 20 mg (20 mg Oral Given 03/05/24 2043)  sodium chloride  0.9 % bolus 1,000 mL (1,000 mLs Intravenous New Bag/Given 03/05/24 2042)  acetaminophen  (TYLENOL ) tablet 650 mg (650 mg Oral Given 03/05/24 2043)                                    Medical Decision Making Amount and/or Complexity of Data Reviewed Labs: ordered. Radiology: ordered.  Risk OTC drugs.   Patient presents to the ED for concern of chest pain, headache, this involves an extensive number of treatment options, and is a complaint that carries with it a high risk of  complications and morbidity.  The differential diagnosis includes stroke, ACS, brain bleed, pericarditis, pneumothorax, pneumonia, etc.   Co morbidities that complicate the patient evaluation  Diabetes, possible history of MI and PE   Additional history obtained:  Multiple past medical records reviewed including 1 note from 2022 which states patient may have history of DVT, PE.  I do not see any documentation of a previous MI.   Lab Tests:  I Ordered, and personally interpreted labs.  The  pertinent results include: CBC-WBC 12.1, BMP unremarkable, troponin 3, D-dimer   Imaging Studies ordered:  I ordered imaging studies including chest x-ray I independently visualized and interpreted imaging which showed no active cardiopulmonary disease I agree with the radiologist interpretation   Cardiac Monitoring:  The patient was maintained on a cardiac monitor.  I personally viewed and interpreted the cardiac monitored which showed an underlying rhythm of: Sinus rhythm   Medicines ordered and prescription drug management:  I ordered medication including Maalox, famotidine , Tylenol  for chest pain, headache, fluids Reevaluation of the patient after these medicines showed that the patient improved I have reviewed the patients home medicines and have made adjustments as needed   Test Considered:  CT head: Declined at this time as patient has no neurologic deficit on my exam, no visual disturbances reported, patient ambulatory without issue, I do not think a CT head is clinically warranted at this time   Critical Interventions:  None   Problem List / ED Course:  43 year old male, chest pain and headache, sudden onset chest pain when driving at approximately 88:69 AM, patient states history of prior heart attack 2013, unclear past medical history patient subjectively reporting possible heart attack and previous DVT Patient denies any blood thinning medications At time of my physical  exam patient states that chest pain is now only minimal, states that he does have a left-sided headache however Normal neuro exam in bed, patient ambulating normally, no facial droop, slurred speech, visual disturbances, or weakness, however does appreciate left sided facial numbness on his left cheek that has been there for the last two days Lab workup overall reassuring, troponin 3, chest x-ray shows no acute cardiopulmonary abnormality D-dimer ordered due to unclear history of DVT versus PE D-dimer negative On reassessment patient feeling overall improved after symptomatic treatment, however still complaining of left-sided facial numbness, patient states that this numbness has been persistent for the past 2 days, out of an abundance of caution CT head ordered Patient signed out to [facility], plan for discharge if CT negative.  Return precautions given by myself Unsure of cause of chest pain based off of workup today, suspicious for possible GI involvement as patient improved with symptomatic treatment in the emergency department, low clinical suspicion for large stroke based off of presentation and exam, however due to persistent facial numbness, CT ordered   Reevaluation:  After the interventions noted above, I reevaluated the patient and found that they have :improved   Social Determinants of Health:  Spanish-speaking   Dispostion:  Patient signed out to PA-C Sponseller who assumes care over this patient. Further work-up and decision making will be decided by this new provider.      Final diagnoses:  Chest pain, unspecified type  Left facial numbness  Acute nonintractable headache, unspecified headache type    ED Discharge Orders     None          Janetta Terrall FALCON, PA-C 03/05/24 2218    Gennaro Bouchard L, DO 03/06/24 1850

## 2024-03-05 NOTE — ED Triage Notes (Signed)
  Patient comes in with chest pain and headache that started earlier today while driving.  Patient states he had mid sternal pain that was non radiating.  Felt dizzy but no N/V, or diaphoresis.  Had similar issue in 2013.  No pain at this time.  Hx T2D.

## 2024-03-05 NOTE — ED Notes (Signed)
 Pt reports his chest pain and headache have improved since being medicated. Labs and Dispo pending

## 2024-03-05 NOTE — ED Provider Notes (Signed)
  Physical Exam  BP 122/69   Pulse 75   Temp 98.1 F (36.7 C) (Oral)   Resp 11   SpO2 99%   Physical Exam  Procedures  Procedures  ED Course / MDM    Medical Decision Making Amount and/or Complexity of Data Reviewed Labs: ordered. Radiology: ordered.  Risk OTC drugs.    Care of this patient assumed from preceding ED provider [facility], PA-C at time of shift change. Please see his associated note for further insight into the patient's ED course. In short, patient with CP, mild HA, and small patch of decreased sensation on the Left cheek x 2 days.   CP workup reassuring. Patient pending head CT at time of shift change. Plan from preceding ED providers is d/c home if CT reassuring given very low clinical concern for CVA.    CT HEAD negative, neuro exam reassuring. Patient no longer with HA or any other concern at this time. Hemodynamically stable, clinical concern for emergent underlying etiology that would warrant further ED workup or inpatient management is exceedingly low.   Angel Barajas voiced understanding of his medical evaluation and treatment plan. Each of their questions answered to their expressed satisfaction.  Return precautions were given.  Patient is well-appearing, stable, and was discharged in good condition.  This chart was dictated using voice recognition software, Dragon. Despite the best efforts of this provider to proofread and correct errors, errors may still occur which can change documentation meaning.    Bobette Pleasant SAUNDERS, PA-C 03/06/24 0009    Pamella Ozell LABOR, DO 03/07/24 RONOLD

## 2024-03-05 NOTE — Discharge Instructions (Addendum)
 It was a pleasure taking care of you today.  Based on your history, physical exam, labs, and imaging I feel you are safe for discharge.  Today your workup was overall reassuring, we saw no evidence of acute heart injury or heart attack, we also saw no evidence of blood clot.  Please follow-up with your primary care provider regarding your visit today.  If experiencing the following symptoms including but not limited to chest pain, shortness of breath, dizziness, weakness, worsening facial numbness, facial droop, visual disturbances, slurred speech please return to the emergency department or seek further medical care.  If symptoms persist or worsen I recommend follow-up with your primary care provider within 48 hours.
